# Patient Record
Sex: Male | Born: 1970 | Race: White | Hispanic: No | Smoking: Never smoker
Health system: Southern US, Community
[De-identification: ages and names within clinical notes are randomized; demographics above are authoritative.]

## PROBLEM LIST (undated history)

## (undated) DIAGNOSIS — R972 Elevated prostate specific antigen [PSA]: Secondary | ICD-10-CM

## (undated) DIAGNOSIS — R569 Unspecified convulsions: Secondary | ICD-10-CM

## (undated) DIAGNOSIS — I10 Essential (primary) hypertension: Secondary | ICD-10-CM

## (undated) DIAGNOSIS — E119 Type 2 diabetes mellitus without complications: Secondary | ICD-10-CM

## (undated) HISTORY — DX: Elevated prostate specific antigen (PSA): R97.20

## (undated) HISTORY — DX: Type 2 diabetes mellitus without complications: E11.9

## (undated) HISTORY — PX: OTHER SURGICAL HISTORY: SHX169

## (undated) HISTORY — PX: NO PAST SURGERIES: SHX2092

---

## 2007-02-05 ENCOUNTER — Ambulatory Visit: Payer: Self-pay | Admitting: Emergency Medicine

## 2007-02-22 ENCOUNTER — Ambulatory Visit: Payer: Self-pay | Admitting: Internal Medicine

## 2012-07-31 ENCOUNTER — Ambulatory Visit: Payer: Self-pay

## 2012-07-31 LAB — CBC WITH DIFFERENTIAL/PLATELET
Basophil #: 0.1 10*3/uL (ref 0.0–0.1)
Basophil %: 1 %
HCT: 45.8 % (ref 40.0–52.0)
HGB: 16.1 g/dL (ref 13.0–18.0)
Lymphocyte #: 2.1 10*3/uL (ref 1.0–3.6)
Lymphocyte %: 27.8 %
MCH: 31.1 pg (ref 26.0–34.0)
MCHC: 35.2 g/dL (ref 32.0–36.0)
Monocyte #: 0.8 x10 3/mm (ref 0.2–1.0)
Monocyte %: 11.3 %
Neutrophil #: 4.1 10*3/uL (ref 1.4–6.5)
Platelet: 226 10*3/uL (ref 150–440)
RDW: 12.3 % (ref 11.5–14.5)
WBC: 7.5 10*3/uL (ref 3.8–10.6)

## 2012-07-31 LAB — COMPREHENSIVE METABOLIC PANEL
Albumin: 4 g/dL (ref 3.4–5.0)
Anion Gap: 8 (ref 7–16)
BUN: 14 mg/dL (ref 7–18)
Calcium, Total: 9.3 mg/dL (ref 8.5–10.1)
Chloride: 105 mmol/L (ref 98–107)
Co2: 25 mmol/L (ref 21–32)
Creatinine: 1.03 mg/dL (ref 0.60–1.30)
Glucose: 113 mg/dL — ABNORMAL HIGH (ref 65–99)
SGPT (ALT): 51 U/L (ref 12–78)
Sodium: 138 mmol/L (ref 136–145)

## 2013-01-29 ENCOUNTER — Ambulatory Visit: Payer: Self-pay

## 2013-06-28 LAB — LIPID PANEL
CHOLESTEROL: 232 mg/dL — AB (ref 0–200)
HDL: 41 mg/dL (ref 35–70)
LDL Cholesterol: 165 mg/dL
Triglycerides: 128 mg/dL (ref 40–160)

## 2013-06-28 LAB — TSH: TSH: 1.6 u[IU]/mL (ref 0.41–5.90)

## 2013-06-28 LAB — CBC AND DIFFERENTIAL: Hemoglobin: 17.6 g/dL — AB (ref 13.5–17.5)

## 2013-06-28 LAB — BASIC METABOLIC PANEL
BUN: 13 mg/dL (ref 4–21)
CREATININE: 0.9 mg/dL (ref 0.6–1.3)

## 2015-03-05 ENCOUNTER — Ambulatory Visit: Payer: BLUE CROSS/BLUE SHIELD

## 2015-03-05 ENCOUNTER — Ambulatory Visit
Admission: EM | Admit: 2015-03-05 | Discharge: 2015-03-05 | Disposition: A | Discharge status: Home / Self Care | Payer: BLUE CROSS/BLUE SHIELD | Attending: Family Medicine | Admitting: Family Medicine

## 2015-03-05 ENCOUNTER — Encounter: Payer: Self-pay | Admitting: Emergency Medicine

## 2015-03-05 DIAGNOSIS — M25551 Pain in right hip: Secondary | ICD-10-CM | POA: Diagnosis not present

## 2015-03-05 DIAGNOSIS — L03115 Cellulitis of right lower limb: Secondary | ICD-10-CM

## 2015-03-05 MED ORDER — SULFAMETHOXAZOLE-TRIMETHOPRIM 800-160 MG PO TABS: 1.0000 | ORAL_TABLET | Freq: Two times a day (BID) | ORAL | Status: AC

## 2015-03-05 NOTE — ED Provider Notes (Signed)
Patient presents today with symptoms of right lateral hip pain. Patient states that he had a metal post about 4 days ago. He does not feel that the skin was broken at the time of the injury. He has started to experience a warmth to the hip area with some redness. He denies any bruising of the area. He does have some pain with walking due to the pain. He has also noticed a subjective fever. He is afebrile in the office today. He denies any history of right hip injury or trauma in the past. He admits to being up-to-date with his tetanus immunization within the last 5 years.  ROS: Negative except mentioned above.  GENERAL: NAD RESP: CTA B CARD: RRR MSK: mild to moderate circular area of erythema (4in x 3in) to the right proximal lateral thigh, this is slightly distal to where a trochanteric bursitis would be, no streaks, tenderness of the area, no streaks  NEURO: CN II-XII groslly intact   A/P: R Hip Injury, Cellulitis- xrays were negative for any acute bony process, will treat with Bactrim DS for 10 days, ice, elevation, monitoring for fever, I would like the patient to follow-up tomorrow or early Friday for interval change, work excuse given for today and tomorrow, if symptoms do worsen he is to seek immediate medical attention as discussed.   Paulina Fusi, MD 03/05/15 [phone]

## 2015-03-05 NOTE — ED Notes (Signed)
Pt hit right hip on a metal post x 4 days ago

## 2015-03-06 ENCOUNTER — Ambulatory Visit
Admission: EM | Admit: 2015-03-06 | Discharge: 2015-03-06 | Disposition: A | Discharge status: Home / Self Care | Payer: BLUE CROSS/BLUE SHIELD | Attending: Family Medicine | Admitting: Family Medicine

## 2015-03-06 ENCOUNTER — Encounter: Payer: Self-pay | Admitting: Emergency Medicine

## 2015-03-06 DIAGNOSIS — L03115 Cellulitis of right lower limb: Secondary | ICD-10-CM | POA: Diagnosis not present

## 2015-03-06 MED ORDER — NAPROXEN 500 MG PO TABS: 500.0000 mg | ORAL_TABLET | Freq: Two times a day (BID) | ORAL | Status: DC

## 2015-03-06 MED ORDER — MUPIROCIN 2 % EX OINT: 1.0000 "application " | TOPICAL_OINTMENT | Freq: Three times a day (TID) | CUTANEOUS | Status: DC

## 2015-03-06 NOTE — ED Notes (Signed)
Pt to have a recheck of cellulitis

## 2015-03-06 NOTE — Discharge Instructions (Signed)
Abscess An abscess is an infected area that contains a collection of pus and debris.It can occur in almost any part of the body. An abscess is also known as a furuncle or boil. CAUSES  An abscess occurs when tissue gets infected. This can occur from blockage of oil or sweat glands, infection of hair follicles, or a minor injury to the skin. As the body tries to fight the infection, pus collects in the area and creates pressure under the skin. This pressure causes pain. People with weakened immune systems have difficulty fighting infections and get certain abscesses more often.  SYMPTOMS Usually an abscess develops on the skin and becomes a painful mass that is red, warm, and tender. If the abscess forms under the skin, you may feel a moveable soft area under the skin. Some abscesses break open (rupture) on their own, but most will continue to get worse without care. The infection can spread deeper into the body and eventually into the bloodstream, causing you to feel ill.  DIAGNOSIS  Your caregiver will take your medical history and perform a physical exam. A sample of fluid may also be taken from the abscess to determine what is causing your infection. TREATMENT  Your caregiver may prescribe antibiotic medicines to fight the infection. However, taking antibiotics alone usually does not cure an abscess. Your caregiver may need to make a small cut (incision) in the abscess to drain the pus. In some cases, gauze is packed into the abscess to reduce pain and to continue draining the area. HOME CARE INSTRUCTIONS   Only take over-the-counter or prescription medicines for pain, discomfort, or fever as directed by your caregiver.  If you were prescribed antibiotics, take them as directed. Finish them even if you start to feel better.  If gauze is used, follow your caregiver's directions for changing the gauze.  To avoid spreading the infection:  Keep your draining abscess covered with a  bandage.  Wash your hands well.  Do not share personal care items, towels, or whirlpools with others.  Avoid skin contact with others.  Keep your skin and clothes clean around the abscess.  Keep all follow-up appointments as directed by your caregiver. SEEK MEDICAL CARE IF:   You have increased pain, swelling, redness, fluid drainage, or bleeding.  You have muscle aches, chills, or a general ill feeling.  You have a fever. MAKE SURE YOU:   Understand these instructions.  Will watch your condition.  Will get help right away if you are not doing well or get worse. Document Released: 03/10/2005 Document Revised: 11/30/2011 Document Reviewed: 08/13/2011 [facility] Patient Information 2015 [place], [place]. This information is not intended to replace advice given to you by your health care provider. Make sure you discuss any questions you have with your health care provider.  Bursitis Bursitis is a swelling and soreness (inflammation) of a fluid-filled sac (bursa) that overlies and protects a joint. It can be caused by injury, overuse of the joint, arthritis or infection. The joints most likely to be affected are the elbows, shoulders, hips and knees. HOME CARE INSTRUCTIONS   Apply ice to the affected area for 15-20 minutes each hour while awake for 2 days. Put the ice in a plastic bag and place a towel between the bag of ice and your skin.  Rest the injured joint as much as possible, but continue to put the joint through a full range of motion, 4 times per day. (The shoulder joint especially becomes rapidly "frozen" if  not used.) When the pain lessens, begin normal slow movements and usual activities.  Only take over-the-counter or prescription medicines for pain, discomfort or fever as directed by your caregiver.  Your caregiver may recommend draining the bursa and injecting medicine into the bursa. This may help the healing process.  Follow all instructions for follow-up with your  caregiver. This includes any orthopedic referrals, physical therapy and rehabilitation. Any delay in obtaining necessary care could result in a delay or failure of the bursitis to heal and chronic pain. SEEK IMMEDIATE MEDICAL CARE IF:   Your pain increases even during treatment.  You develop an oral temperature above 102 F (38.9 C) and have heat and inflammation over the involved bursa. MAKE SURE YOU:   Understand these instructions.  Will watch your condition.  Will get help right away if you are not doing well or get worse. Document Released: 05/28/2000 Document Revised: 08/23/2011 Document Reviewed: 08/20/2013 [facility] Patient Information 2015 [place], [place]. This information is not intended to replace advice given to you by your health care provider. Make sure you discuss any questions you have with your health care provider.

## 2015-03-06 NOTE — ED Provider Notes (Signed)
CSN: [id number]     Arrival date & time 03/06/15  1419 History   First MD Initiated Contact with Patient 03/06/15 1455     Chief Complaint  Patient presents with  . Cellulitis   (Consider location/radiation/quality/duration/timing/severity/associated sxs/prior Treatment) HPI   Is a 44 year old male seen here 2 days ago and treated for possible cellulitis of the right hip superficially over the greater trochanter. Taken 3 doses of the Septra and although it feels better he has noticed that the redness has seemed to spread more. Still feels very warm to touch he has no fever or chills. He works at 2 separate jobs where he stands on his feet all day. He does not specifically state that it is uncomfortable to walk he is not limping.  History reviewed. No pertinent past medical history. History reviewed. No pertinent past surgical history. History reviewed. No pertinent family history. Social History  Substance Use Topics  . Smoking status: Former Research scientist (life sciences)  . Smokeless tobacco: None  . Alcohol Use: Yes    Review of Systems  Constitutional: Negative for fever, chills, activity change and fatigue.  Skin: Positive for color change.  All other systems reviewed and are negative.   Allergies  Review of patient's allergies indicates no known allergies.  Home Medications   Prior to Admission medications   Medication Sig Start Date End Date Taking? Authorizing Provider  mupirocin ointment (BACTROBAN) 2 % Apply 1 application topically 3 (three) times daily. 03/06/15   Lorin Picket, PA-C  naproxen (NAPROSYN) 500 MG tablet Take 1 tablet (500 mg total) by mouth 2 (two) times daily with a meal. 03/06/15   Lorin Picket, PA-C  sulfamethoxazole-trimethoprim (BACTRIM DS,SEPTRA DS) 800-160 MG per tablet Take 1 tablet by mouth 2 (two) times daily. 03/05/15 03/15/15  Paulina Fusi, MD   Meds Ordered and Administered this Visit  Medications - No data to display  BP 159/105 mmHg  Pulse 88   Temp(Src) 99.5 F (37.5 C) (Tympanic)  Resp 20  SpO2 99% No data found.   Physical Exam  Constitutional: He is oriented to person, place, and time. He appears well-developed and well-nourished. No distress.  HENT:  Head: Normocephalic and atraumatic.  Eyes: Pupils are equal, round, and reactive to light.  Musculoskeletal:  Hip range of motion is comfortable and full. Relates with no limp. There is tenderness over the right greater trochanter areas his maximal pain and warmth.  Neurological: He is alert and oriented to person, place, and time.  Skin: Skin is warm and dry. Rash noted. He is not diaphoretic. There is erythema.  Erythema that has been outlined in surgical pen by Dr. Posey Pronto has spread somewhat beyond the borders. It is blanchable and macular.  Psychiatric: He has a normal mood and affect. His behavior is normal. Judgment and thought content normal.  Nursing note and vitals reviewed.   ED Course  Procedures (including critical care time)  Labs Review Labs Reviewed - No data to display  Imaging Review Dg Hip Unilat With Pelvis 2-3 Views Right  03/05/2015   CLINICAL DATA:  Right hip pain for 2 days after hitting metal corner.  EXAM: DG HIP (WITH OR WITHOUT PELVIS) 2-3V RIGHT  COMPARISON:  None.  FINDINGS: There is no evidence of hip fracture or dislocation. There is no evidence of arthropathy or other focal bone abnormality.  IMPRESSION: Negative.   Electronically Signed   By: Rolm Baptise M.D.   On: 03/05/2015 10:22     Visual Acuity  Review  Right Eye Distance:   Left Eye Distance:   Bilateral Distance:    Right Eye Near:   Left Eye Near:    Bilateral Near:         MDM   1. Cellulitis of right hip    Medications - No data to display        Discharge Medication List as of 03/06/2015  3:26 PM    START taking these medications   Details  mupirocin ointment (BACTROBAN) 2 % Apply 1 application topically 3 (three) times daily., Starting 03/06/2015, Until  Discontinued, Normal    naproxen (NAPROSYN) 500 MG tablet Take 1 tablet (500 mg total) by mouth 2 (two) times daily with a meal., Starting 03/06/2015, Until Discontinued, Normal               Plan: 1. Diagnosis reviewed with patient 2. rx as per orders; risks, benefits, potential side effects reviewed with patient 3. Recommend supportive treatment with warm compresses,rest. Naprosyn for pain.  4. F/u 2 days for wound check and progress  Discussion with the patient and recommended that we switch now from eyes to a heat have him apply warm compresses 4 times a day followed with the use of Bactroban ointment. Uncertain whether this is actually a cellulitis since her is no break in the skin or rather a bursitis that is now very inflamed. I will give him some Naprosyn for pain control. An additional keep him out of work since he is working 2 full-time jobs in order to allow him to concentrate on the use of the warm compresses. He has a great deal of warmth to touch over the greater trochanteric region in the area of erythema has spread beyond the borders of the previously outlined area. Will need to keep an eye on their continued to spread.  Lorin Picket, PA-C 03/06/15 1537

## 2015-03-11 ENCOUNTER — Encounter: Payer: Self-pay | Admitting: Emergency Medicine

## 2015-03-11 ENCOUNTER — Ambulatory Visit
Admission: EM | Admit: 2015-03-11 | Discharge: 2015-03-11 | Disposition: A | Discharge status: Home / Self Care | Payer: BLUE CROSS/BLUE SHIELD | Attending: Family Medicine | Admitting: Family Medicine

## 2015-03-11 DIAGNOSIS — M7061 Trochanteric bursitis, right hip: Secondary | ICD-10-CM

## 2015-03-11 HISTORY — DX: Essential (primary) hypertension: I10

## 2015-03-11 MED ORDER — HYDROCHLOROTHIAZIDE 25 MG PO TABS: 25.0000 mg | ORAL_TABLET | Freq: Every day | ORAL | Status: DC

## 2015-03-11 NOTE — ED Provider Notes (Signed)
CSN: [id number]     Arrival date & time 03/11/15  8916 History   First MD Initiated Contact with Patient 03/11/15 0801     Chief Complaint  Patient presents with  . Cellulitis   (Consider location/radiation/quality/duration/timing/severity/associated sxs/prior Treatment) HPI   Patient returns today rating it is much improved beginning Naprosyn and continue on with his Bactrim. He has much less redness and swelling and is not nearly as tender. He has no fever or chills. He has noticed that the "knot" persists although it is not as tender. Is asking for me to fill out his FMLA papers as well as he needs a prescription for his hydrochlorothiazide 25 mg daily until he is able to see his primary care physician.    Past Medical History  Diagnosis Date  . Hypertension    History reviewed. No pertinent past surgical history. History reviewed. No pertinent family history. Social History  Substance Use Topics  . Smoking status: Former Research scientist (life sciences)  . Smokeless tobacco: None  . Alcohol Use: Yes    Review of Systems  Constitutional: Positive for activity change. Negative for fever, chills and fatigue.  All other systems reviewed and are negative.   Allergies  Review of patient's allergies indicates no known allergies.  Home Medications   Prior to Admission medications   Medication Sig Start Date End Date Taking? Authorizing Provider  hydrochlorothiazide (HYDRODIURIL) 25 MG tablet Take 1 tablet (25 mg total) by mouth daily. 03/11/15   Lorin Picket, PA-C  mupirocin ointment (BACTROBAN) 2 % Apply 1 application topically 3 (three) times daily. 03/06/15   Lorin Picket, PA-C  naproxen (NAPROSYN) 500 MG tablet Take 1 tablet (500 mg total) by mouth 2 (two) times daily with a meal. 03/06/15   Lorin Picket, PA-C  sulfamethoxazole-trimethoprim (BACTRIM DS,SEPTRA DS) 800-160 MG per tablet Take 1 tablet by mouth 2 (two) times daily. 03/05/15 03/15/15  Paulina Fusi, MD   Meds Ordered and  Administered this Visit  Medications - No data to display  BP 156/98 mmHg  Pulse 71  Temp(Src) 99 F (37.2 C) (Tympanic)  Resp 16  Ht 5\' 9"  (1.753 m)  Wt 235 lb (106.595 kg)  BMI 34.69 kg/m2  SpO2 98% No data found.   Physical Exam  Constitutional: He is oriented to person, place, and time. He appears well-developed and well-nourished. No distress.  HENT:  Head: Normocephalic and atraumatic.  Eyes: Pupils are equal, round, and reactive to light.  Musculoskeletal: Normal range of motion. He exhibits no edema or tenderness.  Neurological: He is alert and oriented to person, place, and time.  Skin: Skin is warm and dry. No rash noted. He is not diaphoretic. No erythema.  Examination of the right hip shows much less erythema and swelling. There is mild tenderness over a subcutaneous nodule may represent early calcific bursitis over the greater trochanter. The x-ray did not show any abnormality from previously and was not redone today. He has a marked improvement.  Psychiatric: He has a normal mood and affect. His behavior is normal. Judgment and thought content normal.  Nursing note and vitals reviewed.   ED Course  Procedures (including critical care time)  Labs Review Labs Reviewed - No data to display  Imaging Review No results found.   Visual Acuity Review  Right Eye Distance:   Left Eye Distance:   Bilateral Distance:    Right Eye Near:   Left Eye Near:    Bilateral Near:  MDM   1. Greater trochanteric bursitis, right    Discharge Medication List as of 03/11/2015  8:21 AM    START taking these medications   Details  hydrochlorothiazide (HYDRODIURIL) 25 MG tablet Take 1 tablet (25 mg total) by mouth daily., Starting 03/11/2015, Until Discontinued, Print       Plan: 1.Diagnosis reviewed with patient 2. rx as per orders; risks, benefits, potential side effects reviewed with patient 3. Recommend supportive treatment with continued use of the  Naprosyn and finish up his prescription of Septra. I've advised him to follow-up with his primary care because of his elevated blood pressures in addition at his request I refilled his hydrochlorothiazide 25 mg a takes daily till he can establish an appointment with Dr. Army Melia. If the knot remains and is bothersome I recommended that he consider seeing an orthopedic surgeon for follow-up as if the bursa becomes calcific it may need excision. 4. F/u prn if symptoms worsen or don't improve   Lorin Picket, PA-C 03/11/15 [phone]

## 2015-03-11 NOTE — ED Notes (Signed)
Patient here for follow-up visit for possible cellulitis in his right hip. Patient reports improvement in his redness and swelling.  Patient denies fevers.

## 2015-03-11 NOTE — Discharge Instructions (Signed)
Bursitis °Bursitis is a swelling and soreness (inflammation) of a fluid-filled sac (bursa) that overlies and protects a joint. It can be caused by injury, overuse of the joint, arthritis or infection. The joints most likely to be affected are the elbows, shoulders, hips and knees. °HOME CARE INSTRUCTIONS  °· Apply ice to the affected area for 15-20 minutes each hour while awake for 2 days. Put the ice in a plastic bag and place a towel between the bag of ice and your skin. °· Rest the injured joint as much as possible, but continue to put the joint through a full range of motion, 4 times per day. (The shoulder joint especially becomes rapidly "frozen" if not used.) When the pain lessens, begin normal slow movements and usual activities. °· Only take over-the-counter or prescription medicines for pain, discomfort or fever as directed by your caregiver. °· Your caregiver may recommend draining the bursa and injecting medicine into the bursa. This may help the healing process. °· Follow all instructions for follow-up with your caregiver. This includes any orthopedic referrals, physical therapy and rehabilitation. Any delay in obtaining necessary care could result in a delay or failure of the bursitis to heal and chronic pain. °SEEK IMMEDIATE MEDICAL CARE IF:  °· Your pain increases even during treatment. °· You develop an oral temperature above 102° F (38.9° C) and have heat and inflammation over the involved bursa. °MAKE SURE YOU:  °· Understand these instructions. °· Will watch your condition. °· Will get help right away if you are not doing well or get worse. °Document Released: 05/28/2000 Document Revised: 08/23/2011 Document Reviewed: 08/20/2013 °[employer]® Patient Information ©2015 [employer]. This information is not intended to replace advice given to you by your health care provider. Make sure you discuss any questions you have with your health care provider. ° °

## 2015-05-28 ENCOUNTER — Encounter: Payer: Self-pay | Admitting: Internal Medicine

## 2015-05-28 DIAGNOSIS — G4700 Insomnia, unspecified: Secondary | ICD-10-CM | POA: Insufficient documentation

## 2015-05-28 DIAGNOSIS — I10 Essential (primary) hypertension: Secondary | ICD-10-CM | POA: Insufficient documentation

## 2015-05-28 DIAGNOSIS — I8390 Asymptomatic varicose veins of unspecified lower extremity: Secondary | ICD-10-CM | POA: Insufficient documentation

## 2015-05-28 DIAGNOSIS — Z8249 Family history of ischemic heart disease and other diseases of the circulatory system: Secondary | ICD-10-CM | POA: Insufficient documentation

## 2015-08-19 ENCOUNTER — Ambulatory Visit
Admission: EM | Admit: 2015-08-19 | Discharge: 2015-08-19 | Disposition: A | Discharge status: Home / Self Care | Payer: Worker's Compensation | Attending: Family Medicine | Admitting: Family Medicine

## 2015-08-19 ENCOUNTER — Encounter: Payer: Self-pay | Admitting: Emergency Medicine

## 2015-08-19 DIAGNOSIS — S39012A Strain of muscle, fascia and tendon of lower back, initial encounter: Secondary | ICD-10-CM

## 2015-08-19 MED ORDER — NAPROXEN 500 MG PO TABS: 500.0000 mg | ORAL_TABLET | Freq: Two times a day (BID) | ORAL | Status: DC

## 2015-08-19 MED ORDER — METAXALONE 800 MG PO TABS: 800.0000 mg | ORAL_TABLET | Freq: Three times a day (TID) | ORAL | Status: DC

## 2015-08-19 NOTE — ED Notes (Addendum)
Patient states that on Sunday at work he was changing a battery out of a car and pulled a muscle in his lower back.  Patient states that he was seen at [facility] ED on Sunday for this injury. Patient states that he was given prescription for Valium and Naprosyn for his pain.

## 2015-08-19 NOTE — Discharge Instructions (Signed)

## 2015-08-19 NOTE — ED Provider Notes (Signed)
CSN: [id number]     Arrival date & time 08/19/15  1525 History   First MD Initiated Contact with Patient 08/19/15 1849     Chief Complaint  Patient presents with  . Back Pain  . 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 [facility] emergency department on Sunday given Valium and Naprosyn and told to take off work through March 9. He was notified by [facility] today that he needed to be seen here. he is not having any radiation of the pain. He has had no incontinence. The previous back injuries which have resolved with a small amount of care. States that the Valium and the Naprosyn has been starting to work.  Past Medical History  Diagnosis Date  . Hypertension    History reviewed. No pertinent past surgical history. Family History  Problem Relation Age of Onset  . CAD Father   . Lung cancer Mother   . Hypertension Father    Social History  Substance Use Topics  . Smoking status: Former Research scientist (life sciences)  . Smokeless tobacco: None  . Alcohol Use: Yes    Review of Systems  Constitutional: Positive for activity change. Negative for fever, chills and fatigue.  Musculoskeletal: Positive for back pain.  All other systems reviewed and are negative.   Allergies  Review of patient's allergies indicates no known allergies.  Home Medications   Prior to Admission medications   Medication Sig Start Date End Date Taking? Authorizing Provider  hydrochlorothiazide (HYDRODIURIL) 25 MG tablet Take 1 tablet (25 mg total) by mouth daily. 03/11/15   Lorin Picket, PA-C  metaxalone (SKELAXIN) 800 MG tablet Take 1 tablet (800 mg total) by mouth 3 (three) times daily. 08/19/15   Lorin Picket, PA-C  mupirocin ointment (BACTROBAN) 2 % Apply 1 application topically 3 (three) times  daily. 03/06/15   Lorin Picket, PA-C  naproxen (NAPROSYN) 500 MG tablet Take 1 tablet (500 mg total) by mouth 2 (two) times daily with a meal. 08/19/15   Lorin Picket, PA-C   Meds Ordered and Administered this Visit  Medications - No data to display  BP 151/99 mmHg  Pulse 85  Temp(Src) 98.5 F (36.9 C) (Tympanic)  Resp 16  Ht 5\' 7"  (1.702 m)  Wt 243 lb (110.224 kg)  BMI 38.05 kg/m2  SpO2 98% No data found.   Physical Exam  Constitutional: He is oriented to person, place, and time. He appears well-developed and well-nourished. No distress.  HENT:  Head: Normocephalic and atraumatic.  Eyes: Conjunctivae are normal. Pupils are equal, round, and reactive to light.  Neck: Normal range of motion. Neck supple.  Musculoskeletal: He exhibits tenderness.  Examination of the lumbar spine shows palpable spasm on the right. The patient is able to forward flex to touching his toes. Lateral flexion per degree to the right increases the spasm and is more painful. He is able to toe and heel walk without difficulty. Straight leg raise testing is negative at 90 in the sitting position.  Neurological: He is alert and oriented to person, place, and time.  Skin: Skin is warm and dry. He is not diaphoretic.  Psychiatric: He has a normal mood and affect. His behavior is normal. Judgment and thought content normal.  Nursing note and vitals reviewed.   ED  Course  Procedures (including critical care time)  Labs Review Labs Reviewed - No data to display  Imaging Review No results found.   Visual Acuity Review  Right Eye Distance:   Left Eye Distance:   Bilateral Distance:    Right Eye Near:   Left Eye Near:    Bilateral Near:         MDM   1. Lumbar strain, initial encounter    Discharge Medication List as of 08/19/2015  7:18 PM    START taking these medications   Details  metaxalone (SKELAXIN) 800 MG tablet Take 1 tablet (800 mg total) by mouth 3 (three) times daily.,  Starting 08/19/2015, Until Discontinued, Normal      Plan: 1. Diagnosis reviewed with patient 2. rx as per orders; risks, benefits, potential side effects reviewed with patient 3. Recommend supportive treatment with avoidance of symptoms as much as possible. Limit Sitting lifting and bending. Follow up with Adelina Mings FNP. I've given him restrictions at work he is evaluated by Adelina Mings.    Lorin Picket, PA-C 08/19/15 1935

## 2016-04-03 IMAGING — CR DG HIP (WITH OR WITHOUT PELVIS) 2-3V*R*
3 series · 3 of 3 positions shown · non-contrast
Comparison: None.

CLINICAL DATA: Right hip pain for 2 days after hitting metal
corner.

EXAM:
DG HIP (WITH OR WITHOUT PELVIS) 2-3V RIGHT

[pelvis ap]
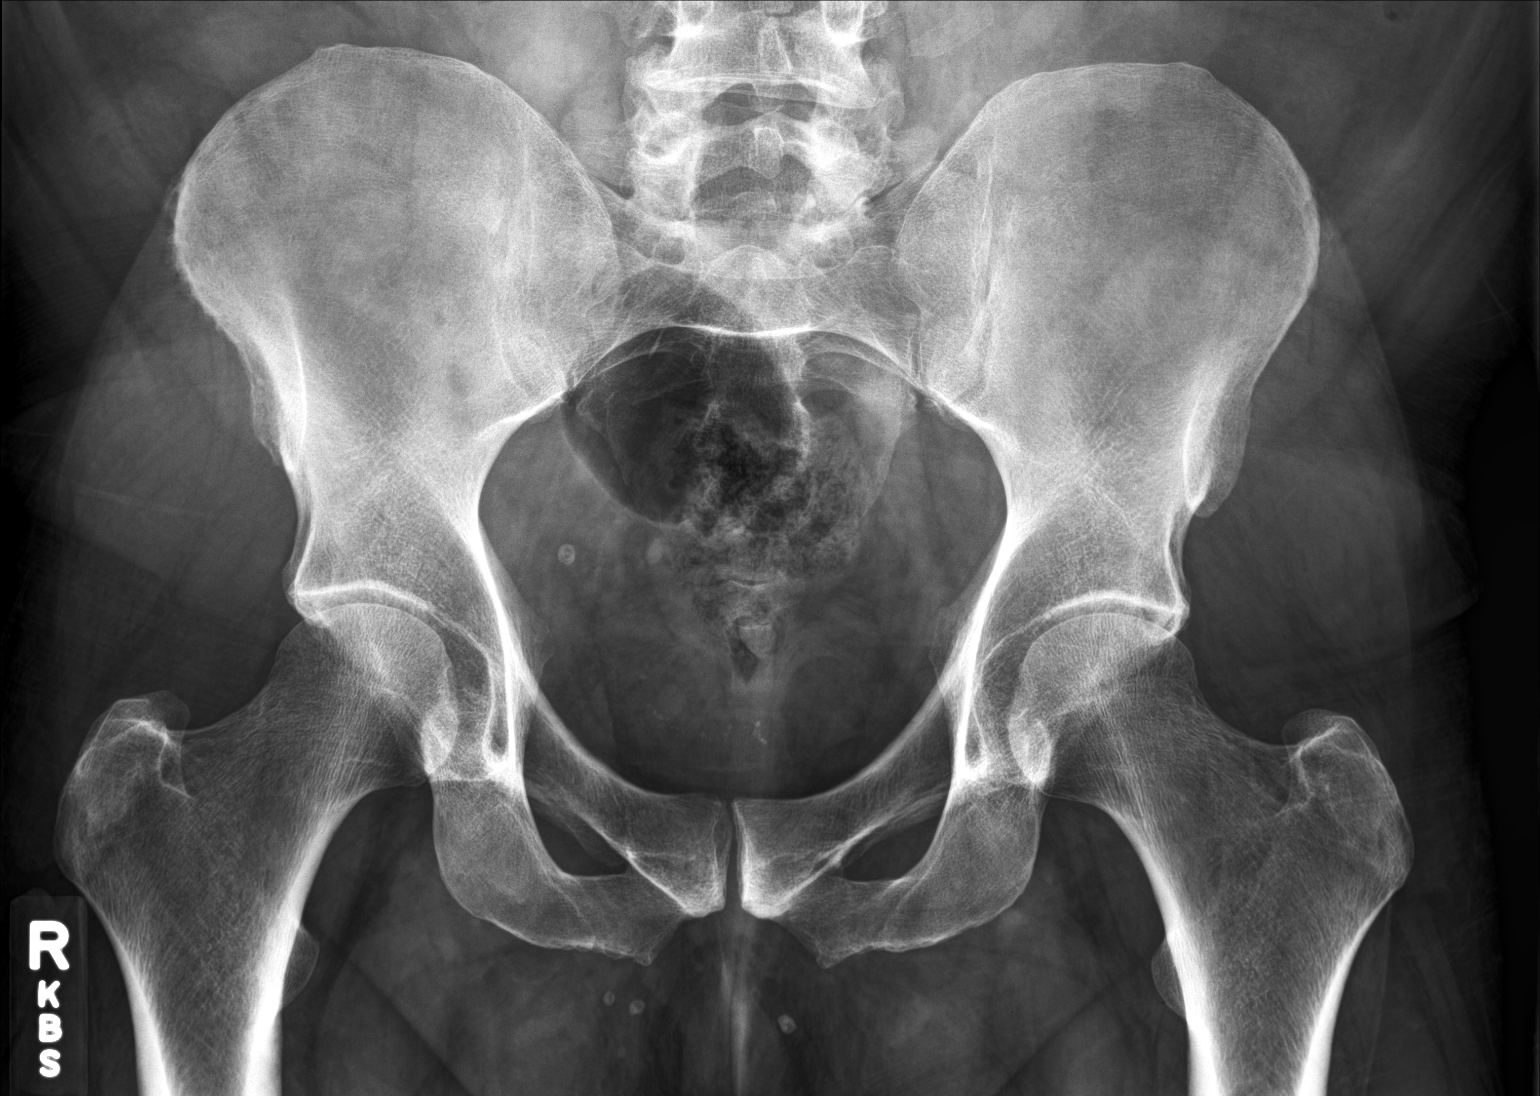

[hip ap]
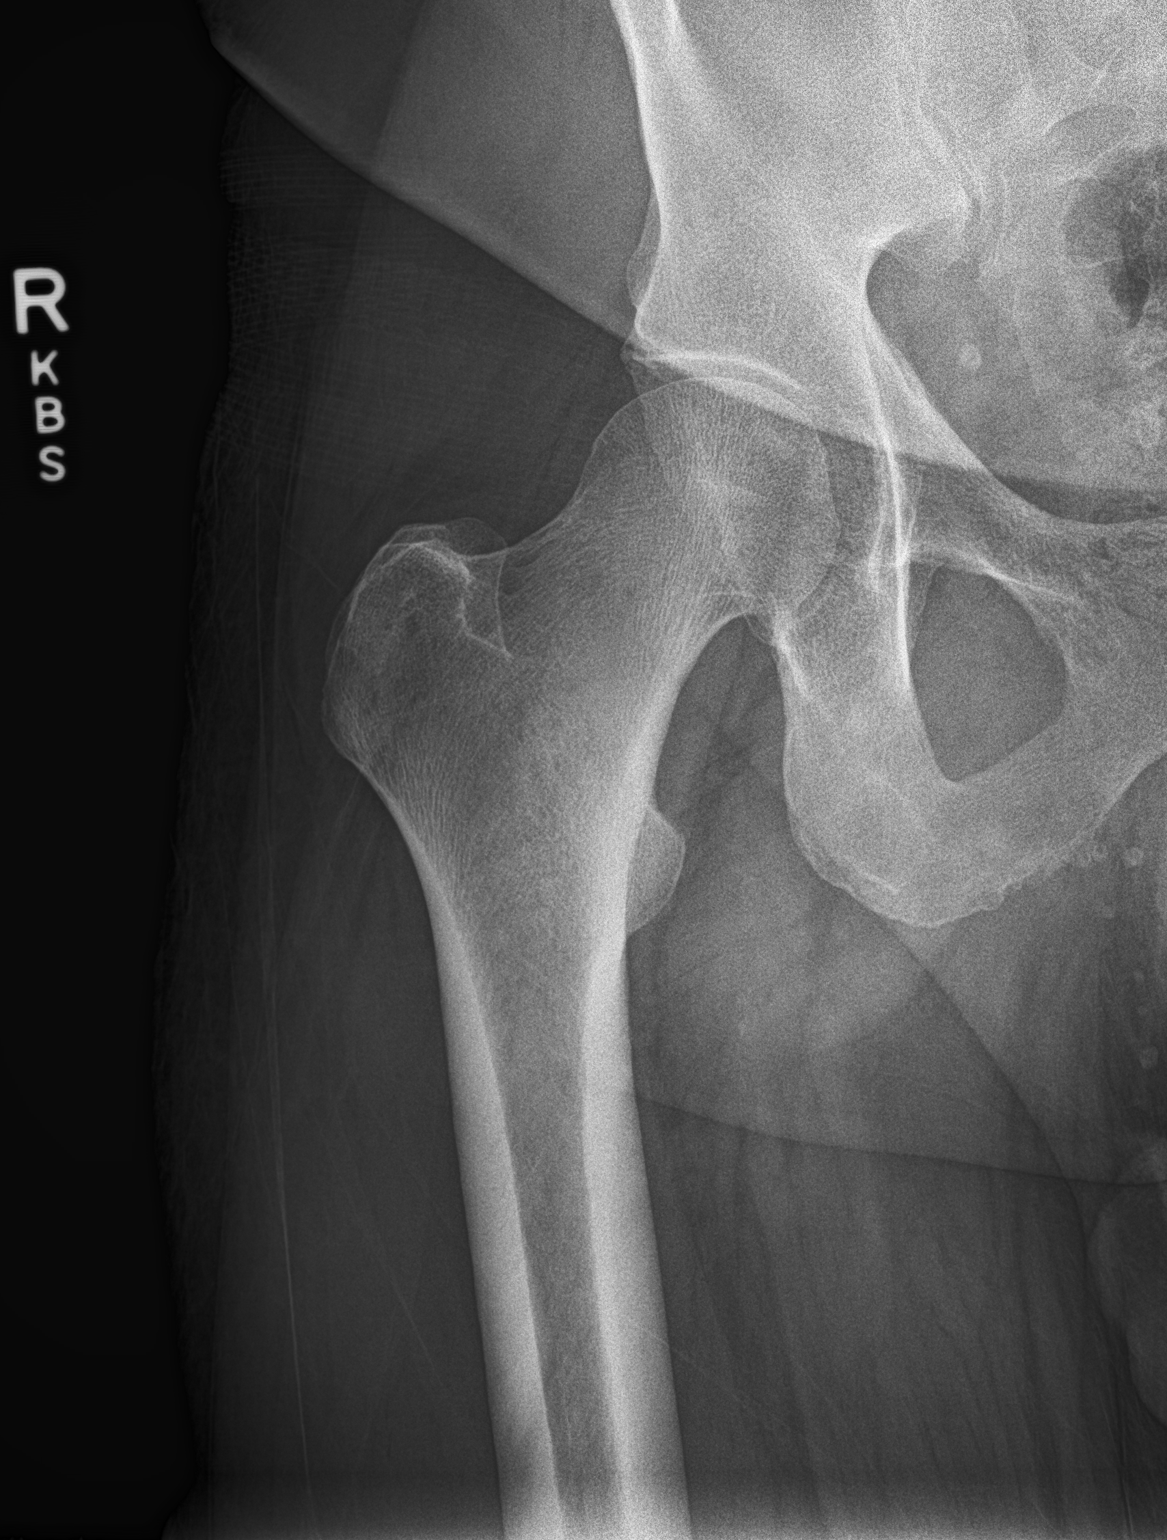

[hip lat]
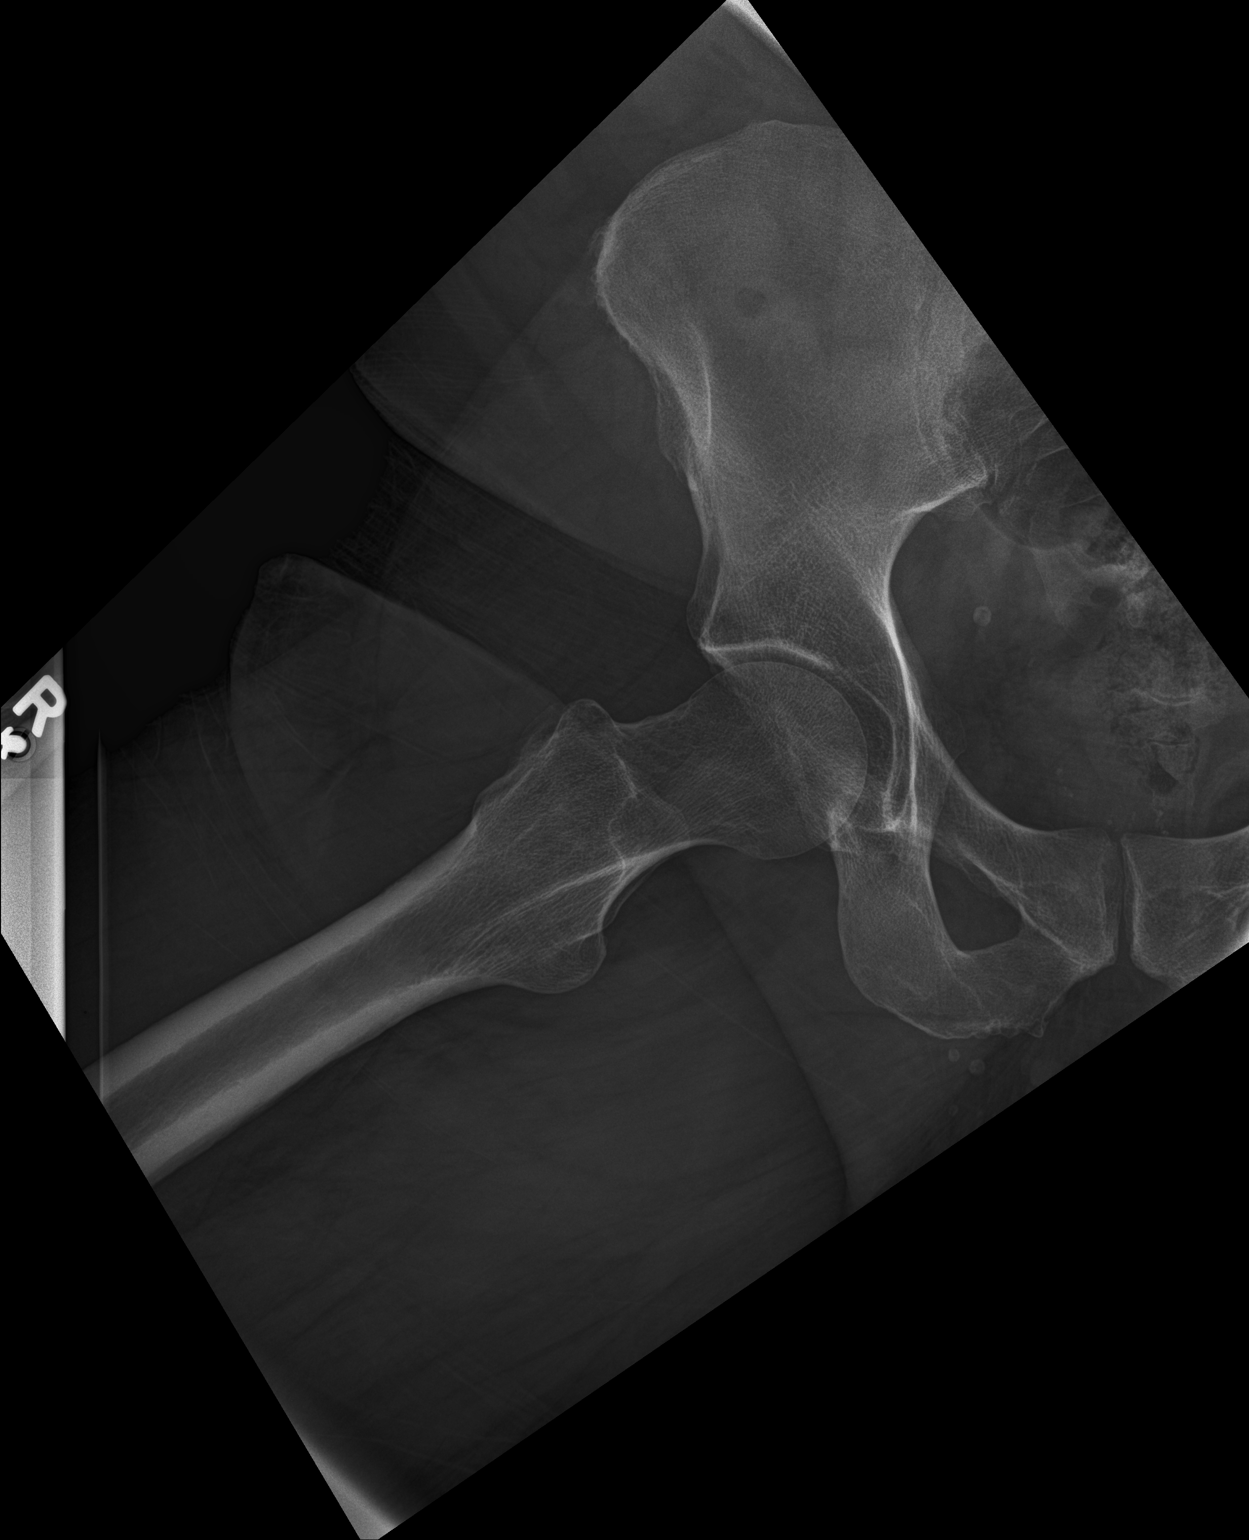

[3 of 3 positions shown; findings below may reference images not displayed]

FINDINGS: There is no evidence of hip fracture or dislocation. There is no
evidence of arthropathy or other focal bone abnormality.
IMPRESSION: Negative.

## 2016-06-24 ENCOUNTER — Ambulatory Visit (INDEPENDENT_AMBULATORY_CARE_PROVIDER_SITE_OTHER): Payer: BLUE CROSS/BLUE SHIELD | Admitting: Internal Medicine

## 2016-06-24 ENCOUNTER — Encounter: Payer: Self-pay | Admitting: Internal Medicine

## 2016-06-24 VITALS — BP 160/102 | HR 78 | Temp 97.6°F | Ht 68.0 in | Wt 243.0 lb

## 2016-06-24 DIAGNOSIS — Z0000 Encounter for general adult medical examination without abnormal findings: Secondary | ICD-10-CM

## 2016-06-24 DIAGNOSIS — Z125 Encounter for screening for malignant neoplasm of prostate: Secondary | ICD-10-CM | POA: Diagnosis not present

## 2016-06-24 DIAGNOSIS — I10 Essential (primary) hypertension: Secondary | ICD-10-CM

## 2016-06-24 DIAGNOSIS — M722 Plantar fascial fibromatosis: Secondary | ICD-10-CM | POA: Diagnosis not present

## 2016-06-24 MED ORDER — VALSARTAN-HYDROCHLOROTHIAZIDE 160-12.5 MG PO TABS: 1.0000 | ORAL_TABLET | Freq: Every day | ORAL | 3 refills | Status: DC

## 2016-06-24 MED ORDER — PREDNISONE 10 MG PO TABS: ORAL_TABLET | ORAL | 0 refills | Status: DC

## 2016-06-24 NOTE — Patient Instructions (Signed)
DASH Eating Plan DASH stands for "Dietary Approaches to Stop Hypertension." The DASH eating plan is a healthy eating plan that has been shown to reduce high blood pressure (hypertension). Additional health benefits may include reducing the risk of type 2 diabetes mellitus, heart disease, and stroke. The DASH eating plan may also help with weight loss. What do I need to know about the DASH eating plan? For the DASH eating plan, you will follow these general guidelines:  Choose foods with less than 150 milligrams of sodium per serving (as listed on the food label).  Use salt-free seasonings or herbs instead of table salt or sea salt.  Check with your health care provider or pharmacist before using salt substitutes.  Eat lower-sodium products. These are often labeled as "low-sodium" or "no salt added."  Eat fresh foods. Avoid eating a lot of canned foods.  Eat more vegetables, fruits, and low-fat dairy products.  Choose whole grains. Look for the word "whole" as the first word in the ingredient list.  Choose fish and skinless chicken or turkey more often than red meat. Limit fish, poultry, and meat to 6 oz (170 g) each day.  Limit sweets, desserts, sugars, and sugary drinks.  Choose heart-healthy fats.  Eat more home-cooked food and less restaurant, buffet, and fast food.  Limit fried foods.  Do not fry foods. Cook foods using methods such as baking, boiling, grilling, and broiling instead.  When eating at a restaurant, ask that your food be prepared with less salt, or no salt if possible. What foods can I eat? Seek help from a dietitian for individual calorie needs. Grains  Whole grain or whole wheat bread. Brown rice. Whole grain or whole wheat pasta. Quinoa, bulgur, and whole grain cereals. Low-sodium cereals. Corn or whole wheat flour tortillas. Whole grain cornbread. Whole grain crackers. Low-sodium crackers. Vegetables  Fresh or frozen vegetables (raw, steamed, roasted, or  grilled). Low-sodium or reduced-sodium tomato and vegetable juices. Low-sodium or reduced-sodium tomato sauce and paste. Low-sodium or reduced-sodium canned vegetables. Fruits  All fresh, canned (in natural juice), or frozen fruits. Meat and Other Protein Products  Ground beef (85% or leaner), grass-fed beef, or beef trimmed of fat. Skinless chicken or turkey. Ground chicken or turkey. Pork trimmed of fat. All fish and seafood. Eggs. Dried beans, peas, or lentils. Unsalted nuts and seeds. Unsalted canned beans. Dairy  Low-fat dairy products, such as skim or 1% milk, 2% or reduced-fat cheeses, low-fat ricotta or cottage cheese, or plain low-fat yogurt. Low-sodium or reduced-sodium cheeses. Fats and Oils  Tub margarines without trans fats. Light or reduced-fat mayonnaise and salad dressings (reduced sodium). Avocado. Safflower, olive, or canola oils. Natural peanut or almond butter. Other  Unsalted popcorn and pretzels. The items listed above may not be a complete list of recommended foods or beverages. Contact your dietitian for more options.  What foods are not recommended? Grains  White bread. White pasta. White rice. Refined cornbread. Bagels and croissants. Crackers that contain trans fat. Vegetables  Creamed or fried vegetables. Vegetables in a cheese sauce. Regular canned vegetables. Regular canned tomato sauce and paste. Regular tomato and vegetable juices. Fruits  Canned fruit in light or heavy syrup. Fruit juice. Meat and Other Protein Products  Fatty cuts of meat. Ribs, chicken wings, bacon, sausage, bologna, salami, chitterlings, fatback, hot dogs, bratwurst, and packaged luncheon meats. Salted nuts and seeds. Canned beans with salt. Dairy  Whole or 2% milk, cream, half-and-half, and cream cheese. Whole-fat or sweetened yogurt. Full-fat cheeses   or blue cheese. Nondairy creamers and whipped toppings. Processed cheese, cheese spreads, or cheese curds. Condiments  Onion and garlic  salt, seasoned salt, table salt, and sea salt. Canned and packaged gravies. Worcestershire sauce. Tartar sauce. Barbecue sauce. Teriyaki sauce. Soy sauce, including reduced sodium. Steak sauce. Fish sauce. Oyster sauce. Cocktail sauce. Horseradish. Ketchup and mustard. Meat flavorings and tenderizers. Bouillon cubes. Hot sauce. Tabasco sauce. Marinades. Taco seasonings. Relishes. Fats and Oils  Butter, stick margarine, lard, shortening, ghee, and bacon fat. Coconut, palm kernel, or palm oils. Regular salad dressings. Other  Pickles and olives. Salted popcorn and pretzels. The items listed above may not be a complete list of foods and beverages to avoid. Contact your dietitian for more information.  Where can I find more information? National Heart, Lung, and Blood Institute: www.nhlbi.nih.gov/health/health-topics/topics/dash/ This information is not intended to replace advice given to you by your health care provider. Make sure you discuss any questions you have with your health care provider. Document Released: 05/20/2011 Document Revised: 11/06/2015 Document Reviewed: 04/04/2013 Elsevier Interactive Patient Education  2017 Elsevier Inc.  

## 2016-06-24 NOTE — Progress Notes (Signed)
Date:  06/24/2016   Name:  William [name]   DOB:  [birth year]   MRN:  [id number]   Chief Complaint: Annual Exam and Ankle Pain William [name] is a 46 y.o. male who presents today for his Complete Annual Exam. He feels fairly well. He reports exercising none. He reports he is sleeping well.  He has not been seen for 2.5 years.  He quit his BP meds 18 months ago.  He does not have any excuse but is now in a new relationship and has promised to take better care of himself.  He is working 2 jobs and only sleeps about 4 hours per day.    Hypertension  This is a chronic problem. The current episode started more than 1 year ago. The problem is uncontrolled (he has been out of medication for 18 months). Associated symptoms include peripheral edema. Pertinent negatives include no blurred vision, chest pain, headaches, palpitations or shortness of breath. There are no associated agents to hypertension. Past treatments include ACE inhibitors and diuretics. The current treatment provides moderate improvement.  Ankle Pain   There was no injury mechanism. The pain is present in the left ankle and right ankle. The quality of the pain is described as aching. The pain is mild. Associated symptoms include an inability to bear weight. Associated symptoms comments: Pain on the in-step and at the heel bilaterally. The symptoms are aggravated by weight bearing (works on his feet on concrete). He has tried heat and NSAIDs for the symptoms. The treatment provided mild relief.     Review of Systems  Constitutional: Negative for chills, fatigue and fever.  Eyes: Negative for blurred vision and visual disturbance.  Respiratory: Negative for cough, chest tightness, shortness of breath and wheezing.   Cardiovascular: Negative for chest pain, palpitations and leg swelling.  Gastrointestinal: Negative for abdominal pain, constipation and diarrhea.  Endocrine: Negative for polydipsia and polyuria.  Genitourinary:  Negative for dysuria and hematuria.  Musculoskeletal: Positive for arthralgias and gait problem.  Skin: Negative for rash.  Neurological: Negative for dizziness and headaches.  Hematological: Negative for adenopathy.  Psychiatric/Behavioral: Negative for sleep disturbance and suicidal ideas.    Patient Active Problem List   Diagnosis Date Noted  . Family history of premature coronary heart disease 05/28/2015  . BP (high blood pressure) 05/28/2015  . Insomnia, persistent 05/28/2015  . 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 Authorizing Provider      Yes Lorin Picket, PA-C  naproxen (NAPROSYN) 500 MG tablet Take 1 tablet (500 mg total) by mouth 2 (two) times daily with a meal. 08/19/15  Yes Lorin Picket, PA-C       Historical Provider, MD       Lorin Picket, PA-C    No Known Allergies  Past Surgical History:  Procedure Laterality Date  . none      Social History  Substance Use Topics  . Smoking status: Never Smoker  . Smokeless tobacco: Never Used  . Alcohol use 0.6 oz/week    1 Standard drinks or equivalent per week     Medication list has been reviewed and updated.   Physical Exam  Constitutional: He is oriented to person, place, and time. He appears well-developed and well-nourished.  HENT:  Head: Normocephalic.  Right Ear: Tympanic membrane, external ear and ear canal normal.  Left Ear: Tympanic membrane, external ear and ear canal normal.  Nose: Nose normal.  Mouth/Throat: Uvula is midline and oropharynx is clear and moist.  Eyes: Conjunctivae and EOM are normal. Pupils are equal, round, and reactive to light.  Neck: Normal range of motion. Neck supple. Carotid bruit is not present. No thyromegaly present.  Cardiovascular: Normal rate, regular rhythm, normal heart sounds and intact distal pulses.   Pulmonary/Chest: Effort normal and breath sounds normal. He has no wheezes. Right breast  exhibits no mass. Left breast exhibits no mass.  Abdominal: Soft. Normal appearance and bowel sounds are normal. There is no hepatosplenomegaly. There is no tenderness.  Musculoskeletal: Normal range of motion. He exhibits edema.       Feet:  Lymphadenopathy:    He has no cervical adenopathy.  Neurological: He is alert and oriented to person, place, and time. He has normal strength and normal reflexes.  Skin: Skin is warm, dry and intact.  Psychiatric: He has a normal mood and affect. His speech is normal and behavior is normal. Judgment and thought content normal.  Nursing note and vitals reviewed.   BP (!) 160/102   Pulse 78   Temp 97.6 F (36.4 C)   Ht 5\' 8"  (1.727 m)   Wt 243 lb (110.2 kg)   SpO2 95%   BMI 36.95 kg/m   Assessment and Plan: 1. Annual physical exam - Lipid panel  2. Essential hypertension Begin medication - valsartan-hydrochlorothiazide (DIOVAN HCT) 160-12.5 MG tablet; Take 1 tablet by mouth daily.  Dispense: 30 tablet; Refill: 3 - Comprehensive metabolic panel - CBC with Differential/Platelet - TSH  3. Plantar fasciitis, bilateral Ice massage - predniSONE (DELTASONE) 10 MG tablet; Take 6 on day 1, 5 on day 2, 4 on day 3, 3 on day 4, 2 on day 5 and 1 on day 1 then stop.  Dispense: 21 tablet; Refill: 0  4. Prostate cancer screening DRE deferred to lack of symptoms - PSA   Halina Maidens, MD [facility]  06/24/2016

## 2016-06-25 LAB — LIPID PANEL
CHOLESTEROL TOTAL: 199 mg/dL (ref 100–199)
Chol/HDL Ratio: 5.2 ratio units — ABNORMAL HIGH (ref 0.0–5.0)
HDL: 38 mg/dL — ABNORMAL LOW (ref >39)
LDL Calculated: 133 mg/dL — ABNORMAL HIGH (ref 0–99)
TRIGLYCERIDES: 138 mg/dL (ref 0–149)
VLDL CHOLESTEROL CAL: 28 mg/dL (ref 5–40)

## 2016-06-25 LAB — COMPREHENSIVE METABOLIC PANEL
A/G RATIO: 1.7 (ref 1.2–2.2)
ALK PHOS: 59 IU/L (ref 39–117)
ALT: 49 IU/L — AB (ref 0–44)
AST: 30 IU/L (ref 0–40)
Albumin: 4.6 g/dL (ref 3.5–5.5)
BILIRUBIN TOTAL: 0.6 mg/dL (ref 0.0–1.2)
BUN/Creatinine Ratio: 16 (ref 9–20)
BUN: 16 mg/dL (ref 6–24)
CHLORIDE: 97 mmol/L (ref 96–106)
CO2: 22 mmol/L (ref 18–29)
Calcium: 9.3 mg/dL (ref 8.7–10.2)
Creatinine, Ser: 1 mg/dL (ref 0.76–1.27)
GFR calc Af Amer: 103 mL/min/{1.73_m2} (ref >59)
GFR calc non Af Amer: 89 mL/min/{1.73_m2} (ref >59)
GLUCOSE: 106 mg/dL — AB (ref 65–99)
Globulin, Total: 2.7 g/dL (ref 1.5–4.5)
POTASSIUM: 4.6 mmol/L (ref 3.5–5.2)
Sodium: 136 mmol/L (ref 134–144)
Total Protein: 7.3 g/dL (ref 6.0–8.5)

## 2016-06-25 LAB — CBC WITH DIFFERENTIAL/PLATELET
BASOS ABS: 0.1 10*3/uL (ref 0.0–0.2)
Basos: 1 %
EOS (ABSOLUTE): 0.3 10*3/uL (ref 0.0–0.4)
Eos: 5 %
Hematocrit: 49.8 % (ref 37.5–51.0)
Hemoglobin: 16.7 g/dL (ref 13.0–17.7)
IMMATURE GRANS (ABS): 0 10*3/uL (ref 0.0–0.1)
Immature Granulocytes: 1 %
LYMPHS: 24 %
Lymphocytes Absolute: 1.6 10*3/uL (ref 0.7–3.1)
MCH: 31.5 pg (ref 26.6–33.0)
MCHC: 33.5 g/dL (ref 31.5–35.7)
MCV: 94 fL (ref 79–97)
MONOCYTES: 10 %
Monocytes Absolute: 0.7 10*3/uL (ref 0.1–0.9)
NEUTROS ABS: 4 10*3/uL (ref 1.4–7.0)
Neutrophils: 59 %
PLATELETS: 209 10*3/uL (ref 150–379)
RBC: 5.31 x10E6/uL (ref 4.14–5.80)
RDW: 13.1 % (ref 12.3–15.4)
WBC: 6.6 10*3/uL (ref 3.4–10.8)

## 2016-06-25 LAB — TSH: TSH: 2.49 u[IU]/mL (ref 0.450–4.500)

## 2016-06-25 LAB — PSA: PROSTATE SPECIFIC AG, SERUM: 1 ng/mL (ref 0.0–4.0)

## 2016-08-03 ENCOUNTER — Ambulatory Visit: Payer: BLUE CROSS/BLUE SHIELD | Admitting: Internal Medicine

## 2016-08-06 ENCOUNTER — Ambulatory Visit (INDEPENDENT_AMBULATORY_CARE_PROVIDER_SITE_OTHER): Payer: BLUE CROSS/BLUE SHIELD | Admitting: Internal Medicine

## 2016-08-06 ENCOUNTER — Encounter: Payer: Self-pay | Admitting: Internal Medicine

## 2016-08-06 VITALS — BP 125/82 | HR 68 | Ht 68.0 in | Wt 246.0 lb

## 2016-08-06 DIAGNOSIS — E784 Other hyperlipidemia: Secondary | ICD-10-CM

## 2016-08-06 DIAGNOSIS — I10 Essential (primary) hypertension: Secondary | ICD-10-CM

## 2016-08-06 DIAGNOSIS — E1169 Type 2 diabetes mellitus with other specified complication: Secondary | ICD-10-CM | POA: Insufficient documentation

## 2016-08-06 DIAGNOSIS — E785 Hyperlipidemia, unspecified: Secondary | ICD-10-CM | POA: Insufficient documentation

## 2016-08-06 MED ORDER — AMLODIPINE BESYLATE 5 MG PO TABS: 5.0000 mg | ORAL_TABLET | Freq: Every day | ORAL | 3 refills | Status: DC

## 2016-08-06 NOTE — Patient Instructions (Addendum)
Get a high quality Fish oil to take daily to help with cholesterol.  Mediterranean Diet A Mediterranean diet refers to food and lifestyle choices that are based on the traditions of countries located on the [employer]. This way of eating has been shown to help prevent certain conditions and improve outcomes for people who have chronic diseases, like kidney disease and heart disease. What are tips for following this plan? Lifestyle  Cook and eat meals together with your family, when possible.  Drink enough fluid to keep your urine clear or pale yellow.  Be physically active every day. This includes:  Aerobic exercise like running or swimming.  Leisure activities like gardening, walking, or housework.  Get 7-8 hours of sleep each night.  If recommended by your health care provider, drink red wine in moderation. This means 1 glass a day for nonpregnant women and 2 glasses a day for men. A glass of wine equals 5 oz (150 mL). Reading food labels  Check the serving size of packaged foods. For foods such as rice and pasta, the serving size refers to the amount of cooked product, not dry.  Check the total fat in packaged foods. Avoid foods that have saturated fat or trans fats.  Check the ingredients list for added sugars, such as corn syrup. Shopping  At the grocery store, buy most of your food from the areas near the walls of the store. This includes:  Fresh fruits and vegetables (produce).  Grains, beans, nuts, and seeds. Some of these may be available in unpackaged forms or large amounts (in bulk).  Fresh seafood.  Poultry and eggs.  Low-fat dairy products.  Buy whole ingredients instead of prepackaged foods.  Buy fresh fruits and vegetables in-season from local farmers markets.  Buy frozen fruits and vegetables in resealable bags.  If you do not have access to quality fresh seafood, buy precooked frozen shrimp or canned fish, such as tuna, salmon, or  sardines.  Buy small amounts of raw or cooked vegetables, salads, or olives from the deli or salad bar at your store.  Stock your pantry so you always have certain foods on hand, such as olive oil, canned tuna, canned tomatoes, rice, pasta, and beans. Cooking  Cook foods with extra-virgin olive oil instead of using butter or other vegetable oils.  Have meat as a side dish, and have vegetables or grains as your main dish. This means having meat in small portions or adding small amounts of meat to foods like pasta or stew.  Use beans or vegetables instead of meat in common dishes like chili or lasagna.  Experiment with different cooking methods. Try roasting or broiling vegetables instead of steaming or sauteing them.  Add frozen vegetables to soups, stews, pasta, or rice.  Add nuts or seeds for added healthy fat at each meal. You can add these to yogurt, salads, or vegetable dishes.  Marinate fish or vegetables using olive oil, lemon juice, garlic, and fresh herbs. Meal planning  Plan to eat 1 vegetarian meal one day each week. Try to work up to 2 vegetarian meals, if possible.  Eat seafood 2 or more times a week.  Have healthy snacks readily available, such as:  Vegetable sticks with hummus.  Greek yogurt.  Fruit and nut trail mix.  Eat balanced meals throughout the week. This includes:  Fruit: 2-3 servings a day  Vegetables: 4-5 servings a day  Low-fat dairy: 2 servings a day  Fish, poultry, or lean meat: 1 serving  a day  Beans and legumes: 2 or more servings a week  Nuts and seeds: 1-2 servings a day  Whole grains: 6-8 servings a day  Extra-virgin olive oil: 3-4 servings a day  Limit red meat and sweets to only a few servings a month What are my food choices?  Mediterranean diet  Recommended  Grains: Whole-grain pasta. Brown rice. Bulgar wheat. Polenta. Couscous. Whole-wheat bread. [place].  Vegetables: Artichokes. Beets. Broccoli. Cabbage.  Carrots. Eggplant. Green beans. Chard. Kale. Spinach. Onions. Leeks. Peas. Squash. Tomatoes. Peppers. Radishes.  Fruits: Apples. Apricots. Avocado. Berries. Bananas. Cherries. Dates. Figs. Grapes. Lemons. Melon. Oranges. Peaches. Plums. Pomegranate.  Meats and other protein foods: Beans. Almonds. Sunflower seeds. Pine nuts. Peanuts. [place]. Salmon. Scallops. Shrimp. Newberry. Tilapia. Clams. Oysters. Eggs.  Dairy: Low-fat milk. Cheese. Greek yogurt.  Beverages: Water. Red wine. Herbal tea.  Fats and oils: Extra virgin olive oil. Avocado oil. Grape seed oil.  Sweets and desserts: [place] yogurt with honey. Baked apples. Poached pears. Trail mix.  Seasoning and other foods: Basil. Cilantro. Coriander. Cumin. Mint. Parsley. Sage. Rosemary. Tarragon. Garlic. Oregano. Thyme. Pepper. Balsalmic vinegar. Tahini. Hummus. Tomato sauce. Olives. Mushrooms.  Limit these  Grains: Prepackaged pasta or rice dishes. Prepackaged cereal with added sugar.  Vegetables: Deep fried potatoes (french fries).  Fruits: Fruit canned in syrup.  Meats and other protein foods: Beef. Pork. Lamb. Poultry with skin. Hot dogs. Berniece Salines.  Dairy: Ice cream. Sour cream. Whole milk.  Beverages: Juice. Sugar-sweetened soft drinks. Beer. Liquor and spirits.  Fats and oils: Butter. Canola oil. Vegetable oil. Beef fat (tallow). Lard.  Sweets and desserts: Cookies. Cakes. Pies. Candy.  Seasoning and other foods: Mayonnaise. Premade sauces and marinades.  The items listed may not be a complete list. Talk with your dietitian about what dietary choices are right for you. Summary  The Mediterranean diet includes both food and lifestyle choices.  Eat a variety of fresh fruits and vegetables, beans, nuts, seeds, and whole grains.  Limit the amount of red meat and sweets that you eat.  Talk with your health care provider about whether it is safe for you to drink red wine in moderation. This means 1 glass a day for nonpregnant women  and 2 glasses a day for men. A glass of wine equals 5 oz (150 mL). This information is not intended to replace advice given to you by your health care provider. Make sure you discuss any questions you have with your health care provider. Document Released: 01/22/2016 Document Revised: 02/24/2016 Document Reviewed: 01/22/2016 Elsevier Interactive Patient Education  2017 [employer].

## 2016-08-06 NOTE — Progress Notes (Signed)
Date:  08/06/2016   Name:  William [name]   DOB:  1971-05-29   MRN:  [id number]   Chief Complaint: Hypertension Hypertension  This is a new problem. The current episode started more than 1 month ago. The problem has been gradually improving since onset. The problem is controlled. Pertinent negatives include no chest pain, headaches, palpitations, peripheral edema or shortness of breath. Risk factors for coronary artery disease include dyslipidemia (low HDL). Past treatments include diuretics and angiotensin blockers. The current treatment provides significant improvement. Compliance problems include medication side effects (ED).   ED resolved when he held the medication for 2 days.    Review of Systems  Constitutional: Negative for appetite change, fatigue and unexpected weight change.  Eyes: Negative for visual disturbance.  Respiratory: Negative for cough, shortness of breath and wheezing.   Cardiovascular: Negative for chest pain, palpitations and leg swelling.  Gastrointestinal: Negative for abdominal pain, blood in stool and constipation.  Endocrine: Negative for polydipsia and polyuria.  Genitourinary: Negative for dysuria and hematuria.       ED since started diovan hct  Skin: Negative for color change and rash.  Neurological: Negative for tremors, numbness and headaches.  Psychiatric/Behavioral: Negative for dysphoric mood.    Patient Active Problem List   Diagnosis Date Noted  . Plantar fasciitis, bilateral 06/24/2016  . Family history of premature coronary heart disease 05/28/2015  . Essential hypertension 05/28/2015  . Insomnia, persistent 05/28/2015  . 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 Authorizing Provider  valsartan-hydrochlorothiazide (DIOVAN HCT) 160-12.5 MG tablet Take 1 tablet by mouth daily. 06/24/16  Yes Glean Hess, MD  naproxen (NAPROSYN) 500 MG tablet Take 1 tablet (500 mg total)  by mouth 2 (two) times daily with a meal. Patient not taking: Reported on 08/06/2016 08/19/15   Lorin Picket, PA-C  predniSONE (DELTASONE) 10 MG tablet Take 6 on day 1, 5 on day 2, 4 on day 3, 3 on day 4, 2 on day 5 and 1 on day 1 then stop. Patient not taking: Reported on 08/06/2016 06/24/16   Glean Hess, MD    No Known Allergies  Past Surgical History:  Procedure Laterality Date  . none      Social History  Substance Use Topics  . Smoking status: Never Smoker  . Smokeless tobacco: Never Used  . Alcohol use 0.6 oz/week    1 Standard drinks or equivalent per week     Medication list has been reviewed and updated.   Physical Exam  Constitutional: He is oriented to person, place, and time. He appears well-developed. No distress.  HENT:  Head: Normocephalic and atraumatic.  Neck: Normal range of motion. Neck supple. No thyromegaly present.  Cardiovascular: Normal rate, regular rhythm and normal heart sounds.   Pulmonary/Chest: Effort normal and breath sounds normal. No respiratory distress. He has no wheezes.  Musculoskeletal: He exhibits no edema or tenderness.  Neurological: He is alert and oriented to person, place, and time.  Skin: Skin is warm and dry. No rash noted.  Psychiatric: He has a normal mood and affect. His behavior is normal. Thought content normal.  Nursing note and vitals reviewed.   BP 125/82   Pulse 68   Ht 5\' 8"  (1.727 m)   Wt 246 lb (111.6 kg)   SpO2 97%   BMI 37.40 kg/m   Assessment and Plan: 1. Essential hypertension Improved control but intolerable  ED with Diovan hct Will change to amlodipine - amLODipine (NORVASC) 5 MG tablet; Take 1 tablet (5 mg total) by mouth daily.  Dispense: 90 tablet; Refill: 3  2. Dyslipidemia (high LDL; low HDL) Recommend continued diet - info on mediterranean diet given Begin quality fish oil supplement daily  Meds ordered this encounter  Medications  . amLODipine (NORVASC) 5 MG tablet    Sig: Take 1  tablet (5 mg total) by mouth daily.    Dispense:  90 tablet    Refill:  [place], MD [facility]  08/06/2016

## 2016-09-14 ENCOUNTER — Encounter: Payer: Self-pay | Admitting: Internal Medicine

## 2016-09-14 ENCOUNTER — Ambulatory Visit (INDEPENDENT_AMBULATORY_CARE_PROVIDER_SITE_OTHER): Payer: BLUE CROSS/BLUE SHIELD | Admitting: Internal Medicine

## 2016-09-14 VITALS — BP 134/84 | HR 64 | Ht 67.0 in | Wt 254.0 lb

## 2016-09-14 DIAGNOSIS — E785 Hyperlipidemia, unspecified: Secondary | ICD-10-CM

## 2016-09-14 DIAGNOSIS — E784 Other hyperlipidemia: Secondary | ICD-10-CM

## 2016-09-14 DIAGNOSIS — I10 Essential (primary) hypertension: Secondary | ICD-10-CM | POA: Diagnosis not present

## 2016-09-14 MED ORDER — HYDROCHLOROTHIAZIDE 25 MG PO TABS: 25.0000 mg | ORAL_TABLET | Freq: Every day | ORAL | 3 refills | Status: DC

## 2016-09-14 NOTE — Progress Notes (Signed)
Date:  09/14/2016   Name:  William [name]   DOB:  July 17, 1970   MRN:  [id number]   Chief Complaint: Hypertension (Ankles swollen and painful. Has not taking bp meds this morning.) and dyslipidemia Hypertension  This is a new problem. The current episode started more than 1 month ago. The problem has been gradually improving since onset. The problem is controlled. Associated symptoms include peripheral edema. Pertinent negatives include no chest pain, headaches, palpitations or shortness of breath. Past treatments include calcium channel blockers. The current treatment provides moderate improvement. Compliance problems include medication side effects (No ED but now edema).   Fam hx CAD - with mild dyslipidemia.  Discussed healthy diet and consider low dose statin therapy once HTN controlled.  Review of Systems  Constitutional: Negative for chills, fatigue and fever.  Respiratory: Negative for chest tightness, shortness of breath and wheezing.   Cardiovascular: Positive for leg swelling. Negative for chest pain and palpitations.  Gastrointestinal: Negative for constipation and diarrhea.  Neurological: Negative for dizziness and headaches.  Psychiatric/Behavioral: The patient is not nervous/anxious.     Patient Active Problem List   Diagnosis Date Noted  . Dyslipidemia (high LDL; low HDL) 08/06/2016  . Plantar fasciitis, bilateral 06/24/2016  . Family history of premature coronary heart disease 05/28/2015  . Essential hypertension 05/28/2015  . Insomnia, persistent 05/28/2015  . 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 Authorizing Provider  amLODipine (NORVASC) 5 MG tablet Take 1 tablet (5 mg total) by mouth daily. 08/06/16  Yes Glean Hess, MD  hydrochlorothiazide (HYDRODIURIL) 25 MG tablet Take 1 tablet (25 mg total) by mouth daily. 09/14/16   Glean Hess, MD    No Known Allergies  Past Surgical History:    Procedure Laterality Date  . none      Social History  Substance Use Topics  . Smoking status: Never Smoker  . Smokeless tobacco: Never Used  . Alcohol use 0.6 oz/week    1 Standard drinks or equivalent per week     Medication list has been reviewed and updated.   Physical Exam  Constitutional: He is oriented to person, place, and time. He appears well-developed. No distress.  HENT:  Head: Normocephalic and atraumatic.  Cardiovascular: Normal rate, regular rhythm and normal heart sounds.   1+ edema both anterior shins  Pulmonary/Chest: Effort normal and breath sounds normal. No respiratory distress. He has no wheezes.  Musculoskeletal: He exhibits edema and tenderness.  Neurological: He is alert and oriented to person, place, and time.  Skin: Skin is warm and dry. No rash noted.  Psychiatric: He has a normal mood and affect. His behavior is normal. Thought content normal.  Nursing note and vitals reviewed.   BP 134/84 (BP Location: Right Arm, Patient Position: Sitting, Cuff Size: Large)   Pulse 64   Ht 5\' 7"  (1.702 m)   Wt 254 lb (115.2 kg)   SpO2 96%   BMI 39.78 kg/m   Assessment and Plan: 1. Essential hypertension Improved but with edema Add HCTZ - call if not improved  2. Dyslipidemia (high LDL; low HDL) Consider statin therapy due to strong fam hx   Meds ordered this encounter  Medications  . hydrochlorothiazide (HYDRODIURIL) 25 MG tablet    Sig: Take 1 tablet (25 mg total) by mouth daily.    Dispense:  90 tablet    Refill:  3    Halina Maidens, MD  [facility]  09/14/2016

## 2017-01-18 ENCOUNTER — Encounter: Payer: Self-pay | Admitting: Internal Medicine

## 2017-01-18 ENCOUNTER — Ambulatory Visit (INDEPENDENT_AMBULATORY_CARE_PROVIDER_SITE_OTHER): Payer: BLUE CROSS/BLUE SHIELD | Admitting: Internal Medicine

## 2017-01-18 VITALS — BP 140/88 | HR 65 | Ht 67.0 in | Wt 245.0 lb

## 2017-01-18 DIAGNOSIS — E785 Hyperlipidemia, unspecified: Secondary | ICD-10-CM

## 2017-01-18 DIAGNOSIS — I10 Essential (primary) hypertension: Secondary | ICD-10-CM | POA: Diagnosis not present

## 2017-01-18 MED ORDER — AMLODIPINE BESYLATE 5 MG PO TABS: 5.0000 mg | ORAL_TABLET | Freq: Every day | ORAL | 3 refills | Status: DC

## 2017-01-18 MED ORDER — HYDROCHLOROTHIAZIDE 25 MG PO TABS: 25.0000 mg | ORAL_TABLET | Freq: Every day | ORAL | 3 refills | Status: DC

## 2017-01-18 NOTE — Progress Notes (Signed)
Date:  01/18/2017   Name:  William [name]   DOB:  1970-08-05   MRN:  [id number]   Chief Complaint: Hypertension (Been out of BP meds for a week- and wants to discuss getting fluid pill increased. Having swelling around ankles and feet. ) Hypertension  This is a chronic problem. The problem has been gradually improving since onset. The problem is controlled. Associated symptoms include peripheral edema. Pertinent negatives include no chest pain, headaches, palpitations or shortness of breath. Past treatments include calcium channel blockers and diuretics.      Review of Systems  Constitutional: Negative for chills, fatigue and fever.  Eyes: Negative for visual disturbance.  Respiratory: Negative for shortness of breath.   Cardiovascular: Negative for chest pain, palpitations and leg swelling.  Gastrointestinal: Negative for abdominal pain.  Musculoskeletal: Negative for gait problem.  Skin: Negative for color change and rash.  Neurological: Negative for dizziness and headaches.    Patient Active Problem List   Diagnosis Date Noted  . Dyslipidemia (high LDL; low HDL) 08/06/2016  . Plantar fasciitis, bilateral 06/24/2016  . Family history of premature coronary heart disease 05/28/2015  . Essential hypertension 05/28/2015  . Insomnia, persistent 05/28/2015  . 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 Authorizing Provider  amLODipine (NORVASC) 5 MG tablet Take 1 tablet (5 mg total) by mouth daily. 08/06/16  Yes Glean Hess, MD  hydrochlorothiazide (HYDRODIURIL) 25 MG tablet Take 1 tablet (25 mg total) by mouth daily. 09/14/16  Yes Glean Hess, MD    No Known Allergies  Past Surgical History:  Procedure Laterality Date  . none      Social History  Substance Use Topics  . Smoking status: Never Smoker  . Smokeless tobacco: Never Used  . Alcohol use 0.6 oz/week    1 Standard drinks or equivalent per  week     Medication list has been reviewed and updated.   Physical Exam  Constitutional: He is oriented to person, place, and time. He appears well-developed. No distress.  HENT:  Head: Normocephalic and atraumatic.  Neck: Normal range of motion. Neck supple.  Cardiovascular: Normal rate, regular rhythm and normal heart sounds.   Pulmonary/Chest: Effort normal and breath sounds normal. No respiratory distress. He has no wheezes.  Musculoskeletal: Normal range of motion.  Neurological: He is alert and oriented to person, place, and time.  Skin: Skin is warm and dry. No rash noted.  Psychiatric: He has a normal mood and affect. His behavior is normal. Thought content normal.  Nursing note and vitals reviewed.   BP 140/88   Pulse 65   Ht 5\' 7"  (1.702 m)   Wt 245 lb (111.1 kg)   SpO2 96%   BMI 38.37 kg/m   Assessment and Plan: 1. Essential hypertension Resume both medications and monitor BP - amLODipine (NORVASC) 5 MG tablet; Take 1 tablet (5 mg total) by mouth daily.  Dispense: 90 tablet; Refill: 3  2. Dyslipidemia (high LDL; low HDL) 10 yr risk 5% so no medication needed at this time.   Meds ordered this encounter  Medications  . hydrochlorothiazide (HYDRODIURIL) 25 MG tablet    Sig: Take 1 tablet (25 mg total) by mouth daily.    Dispense:  90 tablet    Refill:  3  . amLODipine (NORVASC) 5 MG tablet    Sig: Take 1 tablet (5 mg total) by mouth daily.    Dispense:  90 tablet    Refill:  Ida, MD [facility]  01/18/2017

## 2017-04-26 ENCOUNTER — Encounter: Payer: Self-pay | Admitting: Internal Medicine

## 2017-04-26 ENCOUNTER — Ambulatory Visit (INDEPENDENT_AMBULATORY_CARE_PROVIDER_SITE_OTHER): Payer: BLUE CROSS/BLUE SHIELD | Admitting: Internal Medicine

## 2017-04-26 VITALS — BP 142/86 | HR 71 | Temp 98.5°F | Ht 67.0 in | Wt 245.0 lb

## 2017-04-26 DIAGNOSIS — I10 Essential (primary) hypertension: Secondary | ICD-10-CM | POA: Diagnosis not present

## 2017-04-26 DIAGNOSIS — B309 Viral conjunctivitis, unspecified: Secondary | ICD-10-CM

## 2017-04-26 MED ORDER — NEOMYCIN-POLYMYXIN-DEXAMETH 3.5-10000-0.1 OP SUSP: 2.0000 [drp] | Freq: Four times a day (QID) | OPHTHALMIC | 0 refills | Status: DC

## 2017-04-26 NOTE — Progress Notes (Signed)
Date:  04/26/2017   Name:  William [name]   DOB:  05-26-1971   MRN:  [id number]   Chief Complaint: Conjunctivitis (This morning woke up and eye is swollen and pink. No gunk coming out of eye. no itching. ) Conjunctivitis   The current episode started today. The onset was sudden. The problem has been unchanged. The problem is moderate. Nothing relieves the symptoms. Nothing aggravates the symptoms. Associated symptoms include eye redness. Pertinent negatives include no fever, no decreased vision, no double vision, no eye itching, no photophobia, no sore throat, no swollen glands, no eye discharge and no eye pain.  Hypertension  This is a chronic problem. The problem is unchanged. The problem is controlled. Associated symptoms include peripheral edema. Pertinent negatives include no chest pain or shortness of breath. Past treatments include calcium channel blockers and diuretics. The current treatment provides significant improvement.      Review of Systems  Constitutional: Negative for chills, fatigue and fever.  HENT: Negative for sore throat.   Eyes: Positive for redness. Negative for double vision, photophobia, pain, discharge and itching.  Respiratory: Negative for chest tightness and shortness of breath.   Cardiovascular: Negative for chest pain.    Patient Active Problem List   Diagnosis Date Noted  . Dyslipidemia (high LDL; low HDL) 08/06/2016  . Plantar fasciitis, bilateral 06/24/2016  . Family history of premature coronary heart disease 05/28/2015  . Essential hypertension 05/28/2015  . Insomnia, persistent 05/28/2015  . 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 Authorizing Provider  amLODipine (NORVASC) 5 MG tablet Take 1 tablet (5 mg total) by mouth daily. 01/18/17   Glean Hess, MD  hydrochlorothiazide (HYDRODIURIL) 25 MG tablet Take 1 tablet (25 mg total) by mouth daily. 01/18/17   Glean Hess, MD     No Known Allergies  Past Surgical History:  Procedure Laterality Date  . none      Social History   Tobacco Use  . Smoking status: Never Smoker  . Smokeless tobacco: Never Used  Substance Use Topics  . Alcohol use: Yes    Alcohol/week: 0.6 oz    Types: 1 Standard drinks or equivalent per week  . Drug use: No     Medication list has been reviewed and updated.  PHQ 2/9 Scores 06/24/2016  PHQ - 2 Score 0    Physical Exam  Constitutional: He is oriented to person, place, and time. He appears well-developed. No distress.  HENT:  Head: Normocephalic and atraumatic.  Eyes: EOM are normal. Pupils are equal, round, and reactive to light. Right eye exhibits chemosis. Left eye exhibits chemosis. Right conjunctiva is injected. Left conjunctiva is injected.  Pulmonary/Chest: Effort normal. No respiratory distress.  Musculoskeletal: Normal range of motion. He exhibits edema (trace ankle).  Lymphadenopathy:    He has no cervical adenopathy.  Neurological: He is alert and oriented to person, place, and time.  Skin: Skin is warm and dry. No rash noted.  Psychiatric: He has a normal mood and affect. His behavior is normal. Thought content normal.  Nursing note and vitals reviewed.   BP (!) 148/90   Pulse 71   Temp 98.5 F (36.9 C) (Oral)   Ht 5\' 7"  (1.702 m)   Wt 245 lb (111.1 kg)   SpO2 96%   BMI 38.37 kg/m   Assessment and Plan: 1. Acute viral conjunctivitis of both eyes Warm compresses and rest overnight -  neomycin-polymyxin b-dexamethasone (MAXITROL) 3.5-10000-0.1 SUSP; Place 2 drops every 6 (six) hours into both eyes.  Dispense: 5 mL; Refill: 0  2. Essential hypertension May increase HCTZ to 50 mg per day   Meds ordered this encounter  Medications  . neomycin-polymyxin b-dexamethasone (MAXITROL) 3.5-10000-0.1 SUSP    Sig: Place 2 drops every 6 (six) hours into both eyes.    Dispense:  5 mL    Refill:  0    Partially dictated using Editor, commissioning. Any  errors are unintentional.  Halina Maidens, MD [facility]  04/26/2017

## 2017-05-29 ENCOUNTER — Other Ambulatory Visit: Payer: Self-pay | Admitting: Internal Medicine

## 2017-05-29 DIAGNOSIS — M722 Plantar fascial fibromatosis: Secondary | ICD-10-CM

## 2017-07-12 ENCOUNTER — Encounter: Payer: Self-pay | Admitting: Internal Medicine

## 2017-07-12 ENCOUNTER — Ambulatory Visit (INDEPENDENT_AMBULATORY_CARE_PROVIDER_SITE_OTHER): Payer: BLUE CROSS/BLUE SHIELD | Admitting: Internal Medicine

## 2017-07-12 VITALS — BP 124/84 | HR 66 | Ht 67.0 in | Wt 252.0 lb

## 2017-07-12 DIAGNOSIS — G4700 Insomnia, unspecified: Secondary | ICD-10-CM

## 2017-07-12 DIAGNOSIS — Z0001 Encounter for general adult medical examination with abnormal findings: Secondary | ICD-10-CM | POA: Diagnosis not present

## 2017-07-12 DIAGNOSIS — I10 Essential (primary) hypertension: Secondary | ICD-10-CM | POA: Diagnosis not present

## 2017-07-12 DIAGNOSIS — E785 Hyperlipidemia, unspecified: Secondary | ICD-10-CM

## 2017-07-12 DIAGNOSIS — Z125 Encounter for screening for malignant neoplasm of prostate: Secondary | ICD-10-CM

## 2017-07-12 DIAGNOSIS — Z0000 Encounter for general adult medical examination without abnormal findings: Secondary | ICD-10-CM

## 2017-07-12 LAB — POCT URINALYSIS DIPSTICK
Bilirubin, UA: NEGATIVE
Blood, UA: NEGATIVE
GLUCOSE UA: NEGATIVE
KETONES UA: NEGATIVE
Leukocytes, UA: NEGATIVE
NITRITE UA: NEGATIVE
PROTEIN UA: NEGATIVE
Spec Grav, UA: <=1.005 — AB (ref 1.010–1.025)
Urobilinogen, UA: 0.2 E.U./dL
pH, UA: 7 (ref 5.0–8.0)

## 2017-07-12 NOTE — Patient Instructions (Signed)
DASH Eating Plan DASH stands for "Dietary Approaches to Stop Hypertension." The DASH eating plan is a healthy eating plan that has been shown to reduce high blood pressure (hypertension). It may also reduce your risk for type 2 diabetes, heart disease, and stroke. The DASH eating plan may also help with weight loss. What are tips for following this plan? General guidelines  Avoid eating more than 2,300 mg (milligrams) of salt (sodium) a day. If you have hypertension, you may need to reduce your sodium intake to 1,500 mg a day.  Limit alcohol intake to no more than 1 drink a day for nonpregnant women and 2 drinks a day for men. One drink equals 12 oz of beer, 5 oz of wine, or 1 oz of hard liquor.  Work with your health care provider to maintain a healthy body weight or to lose weight. Ask what an ideal weight is for you.  Get at least 30 minutes of exercise that causes your heart to beat faster (aerobic exercise) most days of the week. Activities may include walking, swimming, or biking.  Work with your health care provider or diet and nutrition specialist (dietitian) to adjust your eating plan to your individual calorie needs. Reading food labels  Check food labels for the amount of sodium per serving. Choose foods with less than 5 percent of the Daily Value of sodium. Generally, foods with less than 300 mg of sodium per serving fit into this eating plan.  To find whole grains, look for the word "whole" as the first word in the ingredient list. Shopping  Buy products labeled as "low-sodium" or "no salt added."  Buy fresh foods. Avoid canned foods and premade or frozen meals. Cooking  Avoid adding salt when cooking. Use salt-free seasonings or herbs instead of table salt or sea salt. Check with your health care provider or pharmacist before using salt substitutes.  Do not fry foods. Cook foods using healthy methods such as baking, boiling, grilling, and broiling instead.  Cook with  heart-healthy oils, such as olive, canola, soybean, or sunflower oil. Meal planning   Eat a balanced diet that includes: ? 5 or more servings of fruits and vegetables each day. At each meal, try to fill half of your plate with fruits and vegetables. ? Up to 6-8 servings of whole grains each day. ? Less than 6 oz of lean meat, poultry, or fish each day. A 3-oz serving of meat is about the same size as a deck of cards. One egg equals 1 oz. ? 2 servings of low-fat dairy each day. ? A serving of nuts, seeds, or beans 5 times each week. ? Heart-healthy fats. Healthy fats called Omega-3 fatty acids are found in foods such as flaxseeds and coldwater fish, like sardines, salmon, and mackerel.  Limit how much you eat of the following: ? Canned or prepackaged foods. ? Food that is high in trans fat, such as fried foods. ? Food that is high in saturated fat, such as fatty meat. ? Sweets, desserts, sugary drinks, and other foods with added sugar. ? Full-fat dairy products.  Do not salt foods before eating.  Try to eat at least 2 vegetarian meals each week.  Eat more home-cooked food and less restaurant, buffet, and fast food.  When eating at a restaurant, ask that your food be prepared with less salt or no salt, if possible. What foods are recommended? The items listed may not be a complete list. Talk with your dietitian about what   dietary choices are best for you. Grains Whole-grain or whole-wheat bread. Whole-grain or whole-wheat pasta. Brown rice. Oatmeal. Quinoa. Bulgur. Whole-grain and low-sodium cereals. Pita bread. Low-fat, low-sodium crackers. Whole-wheat flour tortillas. Vegetables Fresh or frozen vegetables (raw, steamed, roasted, or grilled). Low-sodium or reduced-sodium tomato and vegetable juice. Low-sodium or reduced-sodium tomato sauce and tomato paste. Low-sodium or reduced-sodium canned vegetables. Fruits All fresh, dried, or frozen fruit. Canned fruit in natural juice (without  added sugar). Meat and other protein foods Skinless chicken or turkey. Ground chicken or turkey. Pork with fat trimmed off. Fish and seafood. Egg whites. Dried beans, peas, or lentils. Unsalted nuts, nut butters, and seeds. Unsalted canned beans. Lean cuts of beef with fat trimmed off. Low-sodium, lean deli meat. Dairy Low-fat (1%) or fat-free (skim) milk. Fat-free, low-fat, or reduced-fat cheeses. Nonfat, low-sodium ricotta or cottage cheese. Low-fat or nonfat yogurt. Low-fat, low-sodium cheese. Fats and oils Soft margarine without trans fats. Vegetable oil. Low-fat, reduced-fat, or light mayonnaise and salad dressings (reduced-sodium). Canola, safflower, olive, soybean, and sunflower oils. Avocado. Seasoning and other foods Herbs. Spices. Seasoning mixes without salt. Unsalted popcorn and pretzels. Fat-free sweets. What foods are not recommended? The items listed may not be a complete list. Talk with your dietitian about what dietary choices are best for you. Grains Baked goods made with fat, such as croissants, muffins, or some breads. Dry pasta or rice meal packs. Vegetables Creamed or fried vegetables. Vegetables in a cheese sauce. Regular canned vegetables (not low-sodium or reduced-sodium). Regular canned tomato sauce and paste (not low-sodium or reduced-sodium). Regular tomato and vegetable juice (not low-sodium or reduced-sodium). Pickles. Olives. Fruits Canned fruit in a light or heavy syrup. Fried fruit. Fruit in cream or butter sauce. Meat and other protein foods Fatty cuts of meat. Ribs. Fried meat. Bacon. Sausage. Bologna and other processed lunch meats. Salami. Fatback. Hotdogs. Bratwurst. Salted nuts and seeds. Canned beans with added salt. Canned or smoked fish. Whole eggs or egg yolks. Chicken or turkey with skin. Dairy Whole or 2% milk, cream, and half-and-half. Whole or full-fat cream cheese. Whole-fat or sweetened yogurt. Full-fat cheese. Nondairy creamers. Whipped toppings.  Processed cheese and cheese spreads. Fats and oils Butter. Stick margarine. Lard. Shortening. Ghee. Bacon fat. Tropical oils, such as coconut, palm kernel, or palm oil. Seasoning and other foods Salted popcorn and pretzels. Onion salt, garlic salt, seasoned salt, table salt, and sea salt. Worcestershire sauce. Tartar sauce. Barbecue sauce. Teriyaki sauce. Soy sauce, including reduced-sodium. Steak sauce. Canned and packaged gravies. Fish sauce. Oyster sauce. Cocktail sauce. Horseradish that you find on the shelf. Ketchup. Mustard. Meat flavorings and tenderizers. Bouillon cubes. Hot sauce and Tabasco sauce. Premade or packaged marinades. Premade or packaged taco seasonings. Relishes. Regular salad dressings. Where to find more information:  National Heart, Lung, and Blood Institute: www.nhlbi.nih.gov  American Heart Association: www.heart.org Summary  The DASH eating plan is a healthy eating plan that has been shown to reduce high blood pressure (hypertension). It may also reduce your risk for type 2 diabetes, heart disease, and stroke.  With the DASH eating plan, you should limit salt (sodium) intake to 2,300 mg a day. If you have hypertension, you may need to reduce your sodium intake to 1,500 mg a day.  When on the DASH eating plan, aim to eat more fresh fruits and vegetables, whole grains, lean proteins, low-fat dairy, and heart-healthy fats.  Work with your health care provider or diet and nutrition specialist (dietitian) to adjust your eating plan to your individual   calorie needs. This information is not intended to replace advice given to you by your health care provider. Make sure you discuss any questions you have with your health care provider. Document Released: 05/20/2011 Document Revised: 05/24/2016 Document Reviewed: 05/24/2016 Elsevier Interactive Patient Education  2018 Elsevier Inc.  

## 2017-07-12 NOTE — Progress Notes (Signed)
Date:  07/12/2017   Name:  William [name]   DOB:  [birth year]   MRN:  [id number]   Chief Complaint: Annual Exam and Hypertension William [name] is a 47 y.o. male who presents today for his Complete Annual Exam. He feels well. He reports exercising none. He reports he is sleeping fairly well.   Hypertension  This is a chronic problem. The problem is unchanged. The problem is controlled. Pertinent negatives include no chest pain, headaches, palpitations or shortness of breath. Past treatments include calcium channel blockers and diuretics. The current treatment provides significant improvement. There are no compliance problems.       Review of Systems  Constitutional: Negative for appetite change, chills, diaphoresis, fatigue and unexpected weight change.  HENT: Negative for hearing loss, tinnitus, trouble swallowing and voice change.   Eyes: Negative for visual disturbance.  Respiratory: Negative for choking, shortness of breath and wheezing.   Cardiovascular: Negative for chest pain, palpitations and leg swelling.  Gastrointestinal: Negative for abdominal pain, blood in stool, constipation and diarrhea.  Genitourinary: Negative for difficulty urinating, dysuria and frequency.  Musculoskeletal: Positive for arthralgias (right ankle and heel). Negative for back pain and myalgias.  Skin: Negative for color change and rash.  Neurological: Negative for dizziness, syncope and headaches.  Hematological: Negative for adenopathy.  Psychiatric/Behavioral: Positive for sleep disturbance (life long unchanged). Negative for dysphoric mood.    Patient Active Problem List   Diagnosis Date Noted  . Dyslipidemia (high LDL; low HDL) 08/06/2016  . Plantar fasciitis, bilateral 06/24/2016  . Family history of premature coronary heart disease 05/28/2015  . Essential hypertension 05/28/2015  . Insomnia, persistent 05/28/2015  . 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 Authorizing Provider  amLODipine (NORVASC) 5 MG tablet Take 1 tablet (5 mg total) by mouth daily. 01/18/17  Yes Glean Hess, MD  hydrochlorothiazide (HYDRODIURIL) 25 MG tablet Take 1 tablet (25 mg total) by mouth daily. 01/18/17  Yes Glean Hess, MD  neomycin-polymyxin b-dexamethasone (MAXITROL) 3.5-10000-0.1 SUSP Place 2 drops every 6 (six) hours into both eyes. 04/26/17  Yes Glean Hess, MD    No Known Allergies  Past Surgical History:  Procedure Laterality Date  . none      Social History   Tobacco Use  . Smoking status: Never Smoker  . Smokeless tobacco: Never Used  Substance Use Topics  . Alcohol use: Yes    Alcohol/week: 0.6 oz    Types: 1 Standard drinks or equivalent per week  . Drug use: No     Medication list has been reviewed and updated.  PHQ 2/9 Scores 07/12/2017 06/24/2016  PHQ - 2 Score 0 0    Physical Exam  Constitutional: He is oriented to person, place, and time. He appears well-developed and well-nourished.  HENT:  Head: Normocephalic.  Right Ear: Tympanic membrane, external ear and ear canal normal.  Left Ear: Tympanic membrane, external ear and ear canal normal.  Nose: Nose normal.  Mouth/Throat: Uvula is midline and oropharynx is clear and moist.  Eyes: Conjunctivae and EOM are normal. Pupils are equal, round, and reactive to light.  Neck: Normal range of motion. Neck supple. Carotid bruit is not present. No thyromegaly present.  Cardiovascular: Normal rate, regular rhythm, normal heart sounds and intact distal pulses.  Pulmonary/Chest: Effort normal and breath sounds normal. He has no wheezes. Right breast exhibits no mass. Left breast exhibits no mass.  Abdominal: Soft. Normal appearance and bowel sounds are normal. There is no hepatosplenomegaly. There is no tenderness.  Musculoskeletal:  Tender medial ankle and heel - felt to be PF  Lymphadenopathy:    He has no cervical adenopathy.   Neurological: He is alert and oriented to person, place, and time. He has normal reflexes.  Skin: Skin is warm, dry and intact.  Psychiatric: He has a normal mood and affect. His speech is normal and behavior is normal. Judgment and thought content normal.  Nursing note and vitals reviewed.   BP 124/84   Pulse 66   Ht 5\' 7"  (1.702 m)   Wt 252 lb (114.3 kg)   SpO2 99%   BMI 39.47 kg/m   Assessment and Plan: 1. Annual physical exam Normal exam except for weight Discussed taking fish oil supplements - POCT urinalysis dipstick  2. Essential hypertension controlled - CBC with Differential/Platelet - Comprehensive metabolic panel  3. Dyslipidemia (high LDL; low HDL) Will advise on medications - Lipid panel  4. Insomnia, persistent unchanged  5. Prostate cancer screening DRE deferred  - PSA   No orders of the defined types were placed in this encounter.   Partially dictated using Editor, commissioning. Any errors are unintentional.  Halina Maidens, MD [facility]  07/12/2017

## 2017-07-13 LAB — COMPREHENSIVE METABOLIC PANEL
ALK PHOS: 62 IU/L (ref 39–117)
ALT: 53 IU/L — AB (ref 0–44)
AST: 31 IU/L (ref 0–40)
Albumin/Globulin Ratio: 1.8 (ref 1.2–2.2)
Albumin: 4.6 g/dL (ref 3.5–5.5)
BILIRUBIN TOTAL: 0.7 mg/dL (ref 0.0–1.2)
BUN / CREAT RATIO: 14 (ref 9–20)
BUN: 14 mg/dL (ref 6–24)
CHLORIDE: 97 mmol/L (ref 96–106)
CO2: 25 mmol/L (ref 20–29)
Calcium: 8.6 mg/dL — ABNORMAL LOW (ref 8.7–10.2)
Creatinine, Ser: 0.99 mg/dL (ref 0.76–1.27)
GFR calc Af Amer: 104 mL/min/{1.73_m2} (ref >59)
GFR calc non Af Amer: 90 mL/min/{1.73_m2} (ref >59)
Globulin, Total: 2.6 g/dL (ref 1.5–4.5)
Glucose: 113 mg/dL — ABNORMAL HIGH (ref 65–99)
Potassium: 4.5 mmol/L (ref 3.5–5.2)
Sodium: 136 mmol/L (ref 134–144)
Total Protein: 7.2 g/dL (ref 6.0–8.5)

## 2017-07-13 LAB — CBC WITH DIFFERENTIAL/PLATELET
BASOS: 1 %
Basophils Absolute: 0.1 10*3/uL (ref 0.0–0.2)
EOS (ABSOLUTE): 0.5 10*3/uL — ABNORMAL HIGH (ref 0.0–0.4)
EOS: 7 %
Hematocrit: 48.3 % (ref 37.5–51.0)
Hemoglobin: 17 g/dL (ref 13.0–17.7)
IMMATURE GRANS (ABS): 0 10*3/uL (ref 0.0–0.1)
IMMATURE GRANULOCYTES: 0 %
LYMPHS: 30 %
Lymphocytes Absolute: 2 10*3/uL (ref 0.7–3.1)
MCH: 31.8 pg (ref 26.6–33.0)
MCHC: 35.2 g/dL (ref 31.5–35.7)
MCV: 90 fL (ref 79–97)
MONOCYTES: 11 %
MONOS ABS: 0.8 10*3/uL (ref 0.1–0.9)
NEUTROS PCT: 51 %
Neutrophils Absolute: 3.6 10*3/uL (ref 1.4–7.0)
PLATELETS: 205 10*3/uL (ref 150–379)
RBC: 5.34 x10E6/uL (ref 4.14–5.80)
RDW: 12.8 % (ref 12.3–15.4)
WBC: 6.9 10*3/uL (ref 3.4–10.8)

## 2017-07-13 LAB — LIPID PANEL
CHOLESTEROL TOTAL: 203 mg/dL — AB (ref 100–199)
Chol/HDL Ratio: 5.2 ratio — ABNORMAL HIGH (ref 0.0–5.0)
HDL: 39 mg/dL — ABNORMAL LOW (ref >39)
LDL Calculated: 141 mg/dL — ABNORMAL HIGH (ref 0–99)
TRIGLYCERIDES: 114 mg/dL (ref 0–149)
VLDL CHOLESTEROL CAL: 23 mg/dL (ref 5–40)

## 2017-07-13 LAB — PSA: Prostate Specific Ag, Serum: 1.4 ng/mL (ref 0.0–4.0)

## 2018-01-10 ENCOUNTER — Encounter: Payer: Self-pay | Admitting: Internal Medicine

## 2018-01-10 ENCOUNTER — Ambulatory Visit (INDEPENDENT_AMBULATORY_CARE_PROVIDER_SITE_OTHER): Payer: BLUE CROSS/BLUE SHIELD | Admitting: Internal Medicine

## 2018-01-10 VITALS — BP 130/84 | HR 69 | Ht 67.0 in | Wt 247.0 lb

## 2018-01-10 DIAGNOSIS — I10 Essential (primary) hypertension: Secondary | ICD-10-CM

## 2018-01-10 MED ORDER — HYDROCHLOROTHIAZIDE 25 MG PO TABS: 25.0000 mg | ORAL_TABLET | Freq: Every day | ORAL | 3 refills | Status: DC

## 2018-01-10 MED ORDER — AMLODIPINE BESYLATE 5 MG PO TABS: 5.0000 mg | ORAL_TABLET | Freq: Every day | ORAL | 3 refills | Status: DC

## 2018-01-10 NOTE — Patient Instructions (Signed)
DASH Eating Plan DASH stands for "Dietary Approaches to Stop Hypertension." The DASH eating plan is a healthy eating plan that has been shown to reduce high blood pressure (hypertension). It may also reduce your risk for type 2 diabetes, heart disease, and stroke. The DASH eating plan may also help with weight loss. What are tips for following this plan? General guidelines  Avoid eating more than 2,300 mg (milligrams) of salt (sodium) a day. If you have hypertension, you may need to reduce your sodium intake to 1,500 mg a day.  Limit alcohol intake to no more than 1 drink a day for nonpregnant women and 2 drinks a day for men. One drink equals 12 oz of beer, 5 oz of wine, or 1 oz of hard liquor.  Work with your health care provider to maintain a healthy body weight or to lose weight. Ask what an ideal weight is for you.  Get at least 30 minutes of exercise that causes your heart to beat faster (aerobic exercise) most days of the week. Activities may include walking, swimming, or biking.  Work with your health care provider or diet and nutrition specialist (dietitian) to adjust your eating plan to your individual calorie needs. Reading food labels  Check food labels for the amount of sodium per serving. Choose foods with less than 5 percent of the Daily Value of sodium. Generally, foods with less than 300 mg of sodium per serving fit into this eating plan.  To find whole grains, look for the word "whole" as the first word in the ingredient list. Shopping  Buy products labeled as "low-sodium" or "no salt added."  Buy fresh foods. Avoid canned foods and premade or frozen meals. Cooking  Avoid adding salt when cooking. Use salt-free seasonings or herbs instead of table salt or sea salt. Check with your health care provider or pharmacist before using salt substitutes.  Do not fry foods. Cook foods using healthy methods such as baking, boiling, grilling, and broiling instead.  Cook with  heart-healthy oils, such as olive, canola, soybean, or sunflower oil. Meal planning   Eat a balanced diet that includes: ? 5 or more servings of fruits and vegetables each day. At each meal, try to fill half of your plate with fruits and vegetables. ? Up to 6-8 servings of whole grains each day. ? Less than 6 oz of lean meat, poultry, or fish each day. A 3-oz serving of meat is about the same size as a deck of cards. One egg equals 1 oz. ? 2 servings of low-fat dairy each day. ? A serving of nuts, seeds, or beans 5 times each week. ? Heart-healthy fats. Healthy fats called Omega-3 fatty acids are found in foods such as flaxseeds and coldwater fish, like sardines, salmon, and mackerel.  Limit how much you eat of the following: ? Canned or prepackaged foods. ? Food that is high in trans fat, such as fried foods. ? Food that is high in saturated fat, such as fatty meat. ? Sweets, desserts, sugary drinks, and other foods with added sugar. ? Full-fat dairy products.  Do not salt foods before eating.  Try to eat at least 2 vegetarian meals each week.  Eat more home-cooked food and less restaurant, buffet, and fast food.  When eating at a restaurant, ask that your food be prepared with less salt or no salt, if possible. What foods are recommended? The items listed may not be a complete list. Talk with your dietitian about what   dietary choices are best for you. Grains Whole-grain or whole-wheat bread. Whole-grain or whole-wheat pasta. Brown rice. Oatmeal. Quinoa. Bulgur. Whole-grain and low-sodium cereals. Pita bread. Low-fat, low-sodium crackers. Whole-wheat flour tortillas. Vegetables Fresh or frozen vegetables (raw, steamed, roasted, or grilled). Low-sodium or reduced-sodium tomato and vegetable juice. Low-sodium or reduced-sodium tomato sauce and tomato paste. Low-sodium or reduced-sodium canned vegetables. Fruits All fresh, dried, or frozen fruit. Canned fruit in natural juice (without  added sugar). Meat and other protein foods Skinless chicken or turkey. Ground chicken or turkey. Pork with fat trimmed off. Fish and seafood. Egg whites. Dried beans, peas, or lentils. Unsalted nuts, nut butters, and seeds. Unsalted canned beans. Lean cuts of beef with fat trimmed off. Low-sodium, lean deli meat. Dairy Low-fat (1%) or fat-free (skim) milk. Fat-free, low-fat, or reduced-fat cheeses. Nonfat, low-sodium ricotta or cottage cheese. Low-fat or nonfat yogurt. Low-fat, low-sodium cheese. Fats and oils Soft margarine without trans fats. Vegetable oil. Low-fat, reduced-fat, or light mayonnaise and salad dressings (reduced-sodium). Canola, safflower, olive, soybean, and sunflower oils. Avocado. Seasoning and other foods Herbs. Spices. Seasoning mixes without salt. Unsalted popcorn and pretzels. Fat-free sweets. What foods are not recommended? The items listed may not be a complete list. Talk with your dietitian about what dietary choices are best for you. Grains Baked goods made with fat, such as croissants, muffins, or some breads. Dry pasta or rice meal packs. Vegetables Creamed or fried vegetables. Vegetables in a cheese sauce. Regular canned vegetables (not low-sodium or reduced-sodium). Regular canned tomato sauce and paste (not low-sodium or reduced-sodium). Regular tomato and vegetable juice (not low-sodium or reduced-sodium). Pickles. Olives. Fruits Canned fruit in a light or heavy syrup. Fried fruit. Fruit in cream or butter sauce. Meat and other protein foods Fatty cuts of meat. Ribs. Fried meat. Bacon. Sausage. Bologna and other processed lunch meats. Salami. Fatback. Hotdogs. Bratwurst. Salted nuts and seeds. Canned beans with added salt. Canned or smoked fish. Whole eggs or egg yolks. Chicken or turkey with skin. Dairy Whole or 2% milk, cream, and half-and-half. Whole or full-fat cream cheese. Whole-fat or sweetened yogurt. Full-fat cheese. Nondairy creamers. Whipped toppings.  Processed cheese and cheese spreads. Fats and oils Butter. Stick margarine. Lard. Shortening. Ghee. Bacon fat. Tropical oils, such as coconut, palm kernel, or palm oil. Seasoning and other foods Salted popcorn and pretzels. Onion salt, garlic salt, seasoned salt, table salt, and sea salt. Worcestershire sauce. Tartar sauce. Barbecue sauce. Teriyaki sauce. Soy sauce, including reduced-sodium. Steak sauce. Canned and packaged gravies. Fish sauce. Oyster sauce. Cocktail sauce. Horseradish that you find on the shelf. Ketchup. Mustard. Meat flavorings and tenderizers. Bouillon cubes. Hot sauce and Tabasco sauce. Premade or packaged marinades. Premade or packaged taco seasonings. Relishes. Regular salad dressings. Where to find more information:  National Heart, Lung, and Blood Institute: www.nhlbi.nih.gov  American Heart Association: www.heart.org Summary  The DASH eating plan is a healthy eating plan that has been shown to reduce high blood pressure (hypertension). It may also reduce your risk for type 2 diabetes, heart disease, and stroke.  With the DASH eating plan, you should limit salt (sodium) intake to 2,300 mg a day. If you have hypertension, you may need to reduce your sodium intake to 1,500 mg a day.  When on the DASH eating plan, aim to eat more fresh fruits and vegetables, whole grains, lean proteins, low-fat dairy, and heart-healthy fats.  Work with your health care provider or diet and nutrition specialist (dietitian) to adjust your eating plan to your individual   calorie needs. This information is not intended to replace advice given to you by your health care provider. Make sure you discuss any questions you have with your health care provider. Document Released: 05/20/2011 Document Revised: 05/24/2016 Document Reviewed: 05/24/2016 Elsevier Interactive Patient Education  2018 Elsevier Inc.  

## 2018-01-10 NOTE — Progress Notes (Signed)
Date:  01/10/2018   Name:  William [name]   DOB:  1970-09-11   MRN:  [id number]   Chief Complaint: Hypertension Hypertension  This is a chronic problem. The problem is unchanged. The problem is controlled. Pertinent negatives include no chest pain, headaches, palpitations or shortness of breath. There are no associated agents to hypertension. Past treatments include calcium channel blockers and diuretics. The current treatment provides significant improvement. Compliance problems include exercise.    Lab Results  Component Value Date   CREATININE 0.99 07/12/2017   BUN 14 07/12/2017   NA 136 07/12/2017   K 4.5 07/12/2017   CL 97 07/12/2017   CO2 25 07/12/2017      Review of Systems  Constitutional: Negative for chills, fatigue, fever and unexpected weight change (has lost a few pounds with diet change).  Eyes: Negative for visual disturbance.  Respiratory: Negative for cough, chest tightness, shortness of breath and wheezing.   Cardiovascular: Negative for chest pain, palpitations and leg swelling.  Gastrointestinal: Negative for abdominal pain, constipation and diarrhea.  Genitourinary: Negative for dysuria and hematuria.  Musculoskeletal: Negative for arthralgias and joint swelling.  Neurological: Negative for dizziness and headaches.  Psychiatric/Behavioral: Negative for dysphoric mood and sleep disturbance.    Patient Active Problem List   Diagnosis Date Noted  . Dyslipidemia (high LDL; low HDL) 08/06/2016  . Plantar fasciitis, bilateral 06/24/2016  . Family history of premature coronary heart disease 05/28/2015  . Essential hypertension 05/28/2015  . Insomnia, persistent 05/28/2015  . 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 Authorizing Provider  amLODipine (NORVASC) 5 MG tablet Take 1 tablet (5 mg total) by mouth daily. 01/18/17   Glean Hess, MD  hydrochlorothiazide (HYDRODIURIL) 25 MG tablet  Take 1 tablet (25 mg total) by mouth daily. 01/18/17   Glean Hess, MD  neomycin-polymyxin b-dexamethasone (MAXITROL) 3.5-10000-0.1 SUSP Place 2 drops every 6 (six) hours into both eyes. 04/26/17   Glean Hess, MD    No Known Allergies  Past Surgical History:  Procedure Laterality Date  . none      Social History   Tobacco Use  . Smoking status: Never Smoker  . Smokeless tobacco: Never Used  Substance Use Topics  . Alcohol use: Yes    Alcohol/week: 0.6 oz    Types: 1 Standard drinks or equivalent per week  . Drug use: No     Medication list has been reviewed and updated.  Current Meds  Medication Sig  . amLODipine (NORVASC) 5 MG tablet Take 1 tablet (5 mg total) by mouth daily.  . hydrochlorothiazide (HYDRODIURIL) 25 MG tablet Take 1 tablet (25 mg total) by mouth daily.    PHQ 2/9 Scores 07/12/2017 06/24/2016  PHQ - 2 Score 0 0    Physical Exam  Constitutional: He is oriented to person, place, and time. He appears well-developed. No distress.  HENT:  Head: Normocephalic and atraumatic.  Neck: Carotid bruit is not present.  Cardiovascular: Normal rate, regular rhythm and normal heart sounds.  Pulmonary/Chest: Effort normal and breath sounds normal. No respiratory distress.  Musculoskeletal: Normal range of motion. He exhibits no edema or tenderness.  Neurological: He is alert and oriented to person, place, and time.  Skin: Skin is warm and dry. No rash noted.  Psychiatric: He has a normal mood and affect. His behavior is normal. Thought content normal.  Nursing note and vitals reviewed.   BP 130/84  Pulse 69   Ht 5\' 7"  (1.702 m)   Wt 247 lb (112 kg)   SpO2 96%   BMI 38.69 kg/m   Assessment and Plan: 1. Essential hypertension Controlled, continue current medications Encouraged regular aerobic exercise, continue diet changes - hydrochlorothiazide (HYDRODIURIL) 25 MG tablet; Take 1 tablet (25 mg total) by mouth daily.  Dispense: 90 tablet; Refill:  3 - amLODipine (NORVASC) 5 MG tablet; Take 1 tablet (5 mg total) by mouth daily.  Dispense: 90 tablet; Refill: 3   Meds ordered this encounter  Medications  . hydrochlorothiazide (HYDRODIURIL) 25 MG tablet    Sig: Take 1 tablet (25 mg total) by mouth daily.    Dispense:  90 tablet    Refill:  3  . amLODipine (NORVASC) 5 MG tablet    Sig: Take 1 tablet (5 mg total) by mouth daily.    Dispense:  90 tablet    Refill:  3    Partially dictated using Editor, commissioning. Any errors are unintentional.  Halina Maidens, MD [facility]  01/10/2018

## 2018-01-17 ENCOUNTER — Ambulatory Visit: Payer: BLUE CROSS/BLUE SHIELD | Admitting: Internal Medicine

## 2018-07-18 ENCOUNTER — Encounter: Payer: Self-pay | Admitting: Internal Medicine

## 2018-07-18 ENCOUNTER — Ambulatory Visit (INDEPENDENT_AMBULATORY_CARE_PROVIDER_SITE_OTHER): Payer: BLUE CROSS/BLUE SHIELD | Admitting: Internal Medicine

## 2018-07-18 ENCOUNTER — Other Ambulatory Visit: Payer: Self-pay

## 2018-07-18 VITALS — BP 120/78 | HR 61 | Ht 67.0 in | Wt 249.0 lb

## 2018-07-18 DIAGNOSIS — E785 Hyperlipidemia, unspecified: Secondary | ICD-10-CM | POA: Diagnosis not present

## 2018-07-18 DIAGNOSIS — Z0000 Encounter for general adult medical examination without abnormal findings: Secondary | ICD-10-CM

## 2018-07-18 DIAGNOSIS — I10 Essential (primary) hypertension: Secondary | ICD-10-CM | POA: Diagnosis not present

## 2018-07-18 DIAGNOSIS — R7989 Other specified abnormal findings of blood chemistry: Secondary | ICD-10-CM | POA: Diagnosis not present

## 2018-07-18 LAB — POCT URINALYSIS DIPSTICK
Bilirubin, UA: NEGATIVE
Blood, UA: NEGATIVE
Glucose, UA: NEGATIVE
Ketones, UA: NEGATIVE
Leukocytes, UA: NEGATIVE
Nitrite, UA: NEGATIVE
Protein, UA: NEGATIVE
Spec Grav, UA: 1.015 (ref 1.010–1.025)
Urobilinogen, UA: 0.2 E.U./dL
pH, UA: 6.5 (ref 5.0–8.0)

## 2018-07-18 NOTE — Progress Notes (Signed)
Date:  07/18/2018   Name:  William [name]   DOB:  [birth year]   MRN:  [id number]   Chief Complaint: Annual Exam William [name] is a 48 y.o. male who presents today for his Complete Annual Exam. He feels well. He reports exercising rarely. He reports he is sleeping well.   Hypertension  This is a chronic problem. The problem is controlled. Pertinent negatives include no chest pain, headaches, palpitations or shortness of breath. Past treatments include diuretics and calcium channel blockers. The current treatment provides significant improvement.   Lab Results  Component Value Date   CREATININE 0.99 07/12/2017   BUN 14 07/12/2017   NA 136 07/12/2017   K 4.5 07/12/2017   CL 97 07/12/2017   CO2 25 07/12/2017   Lab Results  Component Value Date   CHOL 203 (H) 07/12/2017   HDL 39 (L) 07/12/2017   LDLCALC 141 (H) 07/12/2017   TRIG 114 07/12/2017   CHOLHDL 5.2 (H) 07/12/2017    Review of Systems  Constitutional: Negative for appetite change, chills, diaphoresis, fatigue and unexpected weight change.  HENT: Negative for hearing loss, tinnitus, trouble swallowing and voice change.   Eyes: Negative for visual disturbance.  Respiratory: Negative for choking, shortness of breath and wheezing.   Cardiovascular: Negative for chest pain, palpitations and leg swelling.  Gastrointestinal: Negative for abdominal pain, blood in stool, constipation and diarrhea.  Genitourinary: Negative for difficulty urinating, dysuria and frequency.  Musculoskeletal: Negative for arthralgias, back pain and myalgias.  Skin: Negative for color change and rash.  Neurological: Negative for dizziness, syncope and headaches.  Hematological: Negative for adenopathy.  Psychiatric/Behavioral: Negative for dysphoric mood and sleep disturbance.    Patient Active Problem List   Diagnosis Date Noted  . Dyslipidemia (high LDL; low HDL) 08/06/2016  . Plantar fasciitis, bilateral 06/24/2016  . Family history of  premature coronary heart disease 05/28/2015  . Essential hypertension 05/28/2015  . Insomnia, persistent 05/28/2015  . 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 none      Social History   Tobacco Use  . Smoking status: Never Smoker  . Smokeless tobacco: Never Used  Substance Use Topics  . Alcohol use: Yes    Alcohol/week: 1.0 standard drinks    Types: 1 Standard drinks or equivalent per week  . Drug use: No     Medication list has been reviewed and updated.  Current Meds  Medication Sig  . amLODipine (NORVASC) 5 MG tablet Take 1 tablet (5 mg total) by mouth daily.  . hydrochlorothiazide (HYDRODIURIL) 25 MG tablet Take 1 tablet (25 mg total) by mouth daily.    PHQ 2/9 Scores 07/18/2018 07/12/2017 06/24/2016  PHQ - 2 Score 0 0 0    Physical Exam Vitals signs and nursing note reviewed.  Constitutional:      Appearance: Normal appearance. He is well-developed.  HENT:     Head: Normocephalic.     Right Ear: Tympanic membrane, ear canal and external ear normal.     Left Ear: Tympanic membrane, ear canal and external ear normal.     Nose: Nose normal.     Mouth/Throat:     Pharynx: Uvula midline.  Eyes:     Conjunctiva/sclera: Conjunctivae normal.     Pupils: Pupils are equal, round, and reactive to light.  Neck:     Musculoskeletal: Normal range of motion and neck supple.     Thyroid: No  thyromegaly.     Vascular: No carotid bruit.  Cardiovascular:     Rate and Rhythm: Normal rate and regular rhythm.     Heart sounds: Normal heart sounds.  Pulmonary:     Effort: Pulmonary effort is normal.     Breath sounds: Normal breath sounds. No wheezing.  Chest:     Breasts:        Right: No mass.        Left: No mass.  Abdominal:     General: Bowel sounds are normal.     Palpations: Abdomen is soft.     Tenderness: There is no abdominal tenderness.  Musculoskeletal: Normal range of motion.  Lymphadenopathy:      Cervical: No cervical adenopathy.  Skin:    General: Skin is warm and dry.  Neurological:     Mental Status: He is alert and oriented to person, place, and time.     Deep Tendon Reflexes: Reflexes are normal and symmetric.  Psychiatric:        Speech: Speech normal.        Behavior: Behavior normal.        Thought Content: Thought content normal.        Judgment: Judgment normal.    Wt Readings from Last 3 Encounters:  07/18/18 249 lb (112.9 kg)  01/10/18 247 lb (112 kg)  07/12/17 252 lb (114.3 kg)     BP 120/78   Pulse 61   Ht 5\' 7"  (1.702 m)   Wt 249 lb (112.9 kg)   SpO2 95%   BMI 39.00 kg/m   Assessment and Plan: 1. Annual physical exam Normal exam except for weight Encourage regular exercise and diet changes Pt declines flu vaccine - POCT urinalysis dipstick  2. Essential hypertension controlled - CBC with Differential/Platelet - Comprehensive metabolic panel  3. Dyslipidemia (high LDL; low HDL) Will consider statin therapy age 16 due to family hx - Lipid panel   Partially dictated using Editor, commissioning. Any errors are unintentional.  Halina Maidens, MD [facility]  07/18/2018

## 2018-07-18 NOTE — Patient Instructions (Signed)

## 2018-07-19 LAB — CBC WITH DIFFERENTIAL/PLATELET
BASOS: 1 %
Basophils Absolute: 0.1 10*3/uL (ref 0.0–0.2)
EOS (ABSOLUTE): 0.3 10*3/uL (ref 0.0–0.4)
EOS: 4 %
Hematocrit: 48.2 % (ref 37.5–51.0)
Hemoglobin: 16.7 g/dL (ref 13.0–17.7)
Immature Grans (Abs): 0 10*3/uL (ref 0.0–0.1)
Immature Granulocytes: 0 %
LYMPHS ABS: 2.2 10*3/uL (ref 0.7–3.1)
Lymphs: 28 %
MCH: 32.2 pg (ref 26.6–33.0)
MCHC: 34.6 g/dL (ref 31.5–35.7)
MCV: 93 fL (ref 79–97)
Monocytes Absolute: 0.6 10*3/uL (ref 0.1–0.9)
Monocytes: 8 %
NEUTROS ABS: 4.7 10*3/uL (ref 1.4–7.0)
Neutrophils: 59 %
PLATELETS: 213 10*3/uL (ref 150–450)
RBC: 5.19 x10E6/uL (ref 4.14–5.80)
RDW: 12.6 % (ref 11.6–15.4)
WBC: 7.9 10*3/uL (ref 3.4–10.8)

## 2018-07-19 LAB — COMPREHENSIVE METABOLIC PANEL
ALK PHOS: 64 IU/L (ref 39–117)
ALT: 54 IU/L — AB (ref 0–44)
AST: 34 IU/L (ref 0–40)
Albumin/Globulin Ratio: 1.8 (ref 1.2–2.2)
Albumin: 4.6 g/dL (ref 4.0–5.0)
BILIRUBIN TOTAL: 0.5 mg/dL (ref 0.0–1.2)
BUN/Creatinine Ratio: 13 (ref 9–20)
BUN: 13 mg/dL (ref 6–24)
CHLORIDE: 97 mmol/L (ref 96–106)
CO2: 21 mmol/L (ref 20–29)
Calcium: 9.5 mg/dL (ref 8.7–10.2)
Creatinine, Ser: 1.02 mg/dL (ref 0.76–1.27)
GFR calc non Af Amer: 86 mL/min/{1.73_m2} (ref >59)
GFR, EST AFRICAN AMERICAN: 99 mL/min/{1.73_m2} (ref >59)
GLUCOSE: 120 mg/dL — AB (ref 65–99)
Globulin, Total: 2.6 g/dL (ref 1.5–4.5)
POTASSIUM: 4.1 mmol/L (ref 3.5–5.2)
Sodium: 138 mmol/L (ref 134–144)
TOTAL PROTEIN: 7.2 g/dL (ref 6.0–8.5)

## 2018-07-19 LAB — LIPID PANEL
CHOLESTEROL TOTAL: 202 mg/dL — AB (ref 100–199)
Chol/HDL Ratio: 4.8 ratio (ref 0.0–5.0)
HDL: 42 mg/dL (ref >39)
LDL Calculated: 138 mg/dL — ABNORMAL HIGH (ref 0–99)
TRIGLYCERIDES: 111 mg/dL (ref 0–149)
VLDL Cholesterol Cal: 22 mg/dL (ref 5–40)

## 2018-07-24 LAB — SPECIMEN STATUS REPORT

## 2018-07-24 LAB — HGB A1C W/O EAG: Hgb A1c MFr Bld: 6.7 % — ABNORMAL HIGH (ref 4.8–5.6)

## 2018-11-01 ENCOUNTER — Other Ambulatory Visit: Payer: Self-pay

## 2018-11-01 ENCOUNTER — Encounter: Payer: Self-pay | Admitting: Internal Medicine

## 2018-11-01 ENCOUNTER — Ambulatory Visit (INDEPENDENT_AMBULATORY_CARE_PROVIDER_SITE_OTHER): Payer: BLUE CROSS/BLUE SHIELD | Admitting: Internal Medicine

## 2018-11-01 VITALS — BP 128/64 | HR 78 | Ht 67.0 in | Wt 245.0 lb

## 2018-11-01 DIAGNOSIS — R7303 Prediabetes: Secondary | ICD-10-CM | POA: Diagnosis not present

## 2018-11-01 DIAGNOSIS — E118 Type 2 diabetes mellitus with unspecified complications: Secondary | ICD-10-CM | POA: Insufficient documentation

## 2018-11-01 DIAGNOSIS — I10 Essential (primary) hypertension: Secondary | ICD-10-CM | POA: Diagnosis not present

## 2018-11-01 NOTE — Progress Notes (Signed)
Date:  11/01/2018   Name:  William [name]   DOB:  06-Jul-1970   MRN:  [id number]   Chief Complaint: Diabetes (Follow up.) He reports changing his diet, stopping sodas and was going to the gym before Covid-19 pandemic and had lost about 15 lbs.  Diabetes  He presents for his follow-up diabetic visit. Diabetes type: pre-diabetes. His disease course has been stable. Pertinent negatives for hypoglycemia include no dizziness, headaches or tremors. Pertinent negatives for diabetes include no chest pain, no fatigue, no polydipsia and no polyuria. Current diabetic treatment includes diet. He is compliant with treatment most of the time. Weight trend: down 5 lbs. An ACE inhibitor/angiotensin II receptor blocker is not being taken.  Hypertension  This is a chronic problem. The problem is controlled. Pertinent negatives include no chest pain, headaches, palpitations or shortness of breath. Past treatments include calcium channel blockers and diuretics (had severe ED with [facility]). The current treatment provides significant improvement.   Lab Results  Component Value Date   HGBA1C 6.7 (H) 07/18/2018    Review of Systems  Constitutional: Negative for appetite change, fatigue and unexpected weight change.  Eyes: Negative for visual disturbance.  Respiratory: Negative for cough, shortness of breath and wheezing.   Cardiovascular: Negative for chest pain, palpitations and leg swelling.  Gastrointestinal: Negative for abdominal pain, blood in stool, constipation and diarrhea.  Endocrine: Negative for polydipsia and polyuria.  Genitourinary: Negative for dysuria and hematuria.  Musculoskeletal: Negative for arthralgias.  Skin: Negative for color change and rash.  Allergic/Immunologic: Negative for environmental allergies.  Neurological: Negative for dizziness, tremors, numbness and headaches.  Hematological: Negative for adenopathy.  Psychiatric/Behavioral: Negative for dysphoric mood and sleep  disturbance.    Patient Active Problem List   Diagnosis Date Noted  . Dyslipidemia (high LDL; low HDL) 08/06/2016  . Plantar fasciitis, bilateral 06/24/2016  . Family history of premature coronary heart disease 05/28/2015  . Essential hypertension 05/28/2015  . Insomnia, persistent 05/28/2015  . 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 none      Social History   Tobacco Use  . Smoking status: Never Smoker  . Smokeless tobacco: Never Used  Substance Use Topics  . Alcohol use: Yes    Alcohol/week: 1.0 standard drinks    Types: 1 Standard drinks or equivalent per week  . Drug use: No     Medication list has been reviewed and updated.  Current Meds  Medication Sig  . amLODipine (NORVASC) 5 MG tablet Take 1 tablet (5 mg total) by mouth daily.  . hydrochlorothiazide (HYDRODIURIL) 25 MG tablet Take 1 tablet (25 mg total) by mouth daily.    PHQ 2/9 Scores 11/01/2018 07/18/2018 07/12/2017 06/24/2016  PHQ - 2 Score 0 0 0 0    BP Readings from Last 3 Encounters:  11/01/18 128/64  07/18/18 120/78  01/10/18 130/84    Physical Exam Vitals signs and nursing note reviewed.  Constitutional:      General: He is not in acute distress.    Appearance: He is well-developed.  HENT:     Head: Normocephalic and atraumatic.  Eyes:     Pupils: Pupils are equal, round, and reactive to light.  Neck:     Musculoskeletal: Normal range of motion and neck supple.     Vascular: No carotid bruit.  Cardiovascular:     Rate and Rhythm: Normal rate and regular rhythm.     Pulses:  Normal pulses.  Pulmonary:     Effort: Pulmonary effort is normal. No respiratory distress.     Breath sounds: Normal breath sounds. No wheezing or rhonchi.  Musculoskeletal: Normal range of motion.     Right lower leg: No edema.     Left lower leg: No edema.  Skin:    General: Skin is warm and dry.     Findings: No rash.  Neurological:     Mental  Status: He is alert and oriented to person, place, and time.  Psychiatric:        Behavior: Behavior normal.        Thought Content: Thought content normal.     Wt Readings from Last 3 Encounters:  11/01/18 245 lb (111.1 kg)  07/18/18 249 lb (112.9 kg)  01/10/18 247 lb (112 kg)    BP 128/64   Pulse 78   Ht 5\' 7"  (1.702 m)   Wt 245 lb (111.1 kg)   SpO2 97%   BMI 38.37 kg/m   Assessment and Plan: 1. Pre-diabetes Continue efforts at diet and exercise with weight loss - Hemoglobin A1c  2. Essential hypertension controlled   Partially dictated using Editor, commissioning. Any errors are unintentional.  Halina Maidens, MD [facility]  11/01/2018

## 2018-11-02 LAB — HEMOGLOBIN A1C
Est. average glucose Bld gHb Est-mCnc: 134 mg/dL
Hgb A1c MFr Bld: 6.3 % — ABNORMAL HIGH (ref 4.8–5.6)

## 2019-01-24 ENCOUNTER — Other Ambulatory Visit: Payer: Self-pay

## 2019-01-24 ENCOUNTER — Ambulatory Visit (INDEPENDENT_AMBULATORY_CARE_PROVIDER_SITE_OTHER): Payer: BC Managed Care – PPO | Admitting: Internal Medicine

## 2019-01-24 ENCOUNTER — Encounter: Payer: Self-pay | Admitting: Internal Medicine

## 2019-01-24 VITALS — BP 126/86 | HR 87 | Ht 67.0 in | Wt 242.0 lb

## 2019-01-24 DIAGNOSIS — E785 Hyperlipidemia, unspecified: Secondary | ICD-10-CM

## 2019-01-24 DIAGNOSIS — I10 Essential (primary) hypertension: Secondary | ICD-10-CM | POA: Diagnosis not present

## 2019-01-24 DIAGNOSIS — R7303 Prediabetes: Secondary | ICD-10-CM | POA: Diagnosis not present

## 2019-01-24 MED ORDER — AMLODIPINE BESYLATE 5 MG PO TABS: 5.0000 mg | ORAL_TABLET | Freq: Every day | ORAL | 3 refills | Status: DC

## 2019-01-24 MED ORDER — HYDROCHLOROTHIAZIDE 25 MG PO TABS: 25.0000 mg | ORAL_TABLET | Freq: Every day | ORAL | 3 refills | Status: DC

## 2019-01-24 NOTE — Progress Notes (Signed)
Date:  01/24/2019   Name:  William [name]   DOB:  Jun 06, 1971   MRN:  [id number]   Chief Complaint: Hypertension  Hypertension This is a chronic problem. The problem is controlled. Pertinent negatives include no chest pain, headaches, palpitations or shortness of breath. There are no associated agents to hypertension. Risk factors for coronary artery disease include diabetes mellitus, male gender and family history. Past treatments include diuretics and calcium channel blockers. The current treatment provides significant improvement. Compliance problems: BP has been good at the plasma donation center.   Diabetes He presents for his follow-up diabetic visit. Diabetes type: prediabetes. His disease course has been improving. Pertinent negatives for hypoglycemia include no headaches or tremors. Pertinent negatives for diabetes include no chest pain, no fatigue, no polydipsia and no polyuria. Symptoms are stable. Current diabetic treatment includes diet. His weight is stable (has lost a few pounds). There is no compliance with monitoring of blood glucose. An ACE inhibitor/angiotensin II receptor blocker is not being taken.  Hyperlipidemia This is a chronic problem. The problem is uncontrolled. Pertinent negatives include no chest pain or shortness of breath. Current antihyperlipidemic treatment includes diet change (has cut back on red meats and sugary sodas). Risk factors for coronary artery disease include family history, diabetes mellitus, hypertension, male sex and obesity.  He is not interested in cholesterol lowering medications at this time.  Discussed goal for LDL < 130.    Lab Results  Component Value Date   HGBA1C 6.3 (H) 11/01/2018   Lab Results  Component Value Date   CREATININE 1.02 07/18/2018   BUN 13 07/18/2018   NA 138 07/18/2018   K 4.1 07/18/2018   CL 97 07/18/2018   CO2 21 07/18/2018   Lab Results  Component Value Date   CHOL 202 (H) 07/18/2018   HDL 42 07/18/2018   LDLCALC 138 (H) 07/18/2018   TRIG 111 07/18/2018   CHOLHDL 4.8 07/18/2018    Review of Systems  Constitutional: Negative for appetite change, fatigue and unexpected weight change.  Eyes: Negative for visual disturbance.  Respiratory: Negative for cough, shortness of breath and wheezing.   Cardiovascular: Negative for chest pain, palpitations and leg swelling.  Gastrointestinal: Negative for abdominal pain and blood in stool.  Endocrine: Negative for polydipsia and polyuria.  Genitourinary: Negative for dysuria and hematuria.  Skin: Negative for color change and rash.  Neurological: Negative for tremors, numbness and headaches.  Psychiatric/Behavioral: Negative for dysphoric mood.    Patient Active Problem List   Diagnosis Date Noted  . Pre-diabetes 11/01/2018  . Dyslipidemia (high LDL; low HDL) 08/06/2016  . Plantar fasciitis, bilateral 06/24/2016  . Family history of premature coronary heart disease 05/28/2015  . Essential hypertension 05/28/2015  . Insomnia, persistent 05/28/2015  . 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 none      Social History   Tobacco Use  . Smoking status: Never Smoker  . Smokeless tobacco: Never Used  Substance Use Topics  . Alcohol use: Yes    Alcohol/week: 1.0 standard drinks    Types: 1 Standard drinks or equivalent per week  . Drug use: No     Medication list has been reviewed and updated.  Current Meds  Medication Sig  . amLODipine (NORVASC) 5 MG tablet Take 1 tablet (5 mg total) by mouth daily.  . hydrochlorothiazide (HYDRODIURIL) 25 MG tablet Take 1 tablet (25 mg total) by mouth daily.  PHQ 2/9 Scores 01/24/2019 11/01/2018 07/18/2018 07/12/2017  PHQ - 2 Score 0 0 0 0    BP Readings from Last 3 Encounters:  01/24/19 126/86  11/01/18 128/64  07/18/18 120/78    Physical Exam Vitals signs and nursing note reviewed.  Constitutional:      General: He is not in  acute distress.    Appearance: He is well-developed.  HENT:     Head: Normocephalic and atraumatic.  Neck:     Musculoskeletal: Normal range of motion and neck supple.  Cardiovascular:     Rate and Rhythm: Normal rate and regular rhythm.     Pulses: Normal pulses.     Heart sounds: No murmur.  Pulmonary:     Effort: Pulmonary effort is normal. No respiratory distress.  Musculoskeletal: Normal range of motion.        General: Swelling (trace ankle edema) present.  Skin:    General: Skin is warm and dry.     Capillary Refill: Capillary refill takes less than 2 seconds.     Findings: No rash.  Neurological:     General: No focal deficit present.     Mental Status: He is alert and oriented to person, place, and time.  Psychiatric:        Behavior: Behavior normal.        Thought Content: Thought content normal.     Wt Readings from Last 3 Encounters:  01/24/19 242 lb (109.8 kg)  11/01/18 245 lb (111.1 kg)  07/18/18 249 lb (112.9 kg)    BP 126/86   Pulse 87   Ht 5\' 7"  (1.702 m)   Wt 242 lb (109.8 kg)   SpO2 97%   BMI 37.90 kg/m   Assessment and Plan: 1. Essential hypertension BP is controlled, pt is asx with a normal exam besides weight Continue to work on weight loss, exercise and continue current medications - Comprehensive metabolic panel - amLODipine (NORVASC) 5 MG tablet; Take 1 tablet (5 mg total) by mouth daily.  Dispense: 90 tablet; Refill: 3 - hydrochlorothiazide (HYDRODIURIL) 25 MG tablet; Take 1 tablet (25 mg total) by mouth daily.  Dispense: 90 tablet; Refill: 3  2. Pre-diabetes Weight and diet continue to improve Will advise if medication needs to be initiated - Comprehensive metabolic panel - Hemoglobin A1c  3. Dyslipidemia (high LDL; low HDL) Goal LDL < 130 given multiple risk factors  - he will continue to limit fats, continue exercise and consider medications if not improving - Lipid panel   Partially dictated using Dragon software. Any errors  are unintentional.  Halina Maidens, MD [facility]  01/24/2019

## 2019-01-25 LAB — COMPREHENSIVE METABOLIC PANEL
ALT: 50 IU/L — ABNORMAL HIGH (ref 0–44)
AST: 32 IU/L (ref 0–40)
Albumin/Globulin Ratio: 2.2 (ref 1.2–2.2)
Albumin: 4.3 g/dL (ref 4.0–5.0)
Alkaline Phosphatase: 55 IU/L (ref 39–117)
BUN/Creatinine Ratio: 14 (ref 9–20)
BUN: 18 mg/dL (ref 6–24)
Bilirubin Total: 0.4 mg/dL (ref 0.0–1.2)
CO2: 23 mmol/L (ref 20–29)
Calcium: 8.9 mg/dL (ref 8.7–10.2)
Chloride: 98 mmol/L (ref 96–106)
Creatinine, Ser: 1.31 mg/dL — ABNORMAL HIGH (ref 0.76–1.27)
GFR calc Af Amer: 73 mL/min/{1.73_m2} (ref >59)
GFR calc non Af Amer: 63 mL/min/{1.73_m2} (ref >59)
Globulin, Total: 2 g/dL (ref 1.5–4.5)
Glucose: 147 mg/dL — ABNORMAL HIGH (ref 65–99)
Potassium: 3.9 mmol/L (ref 3.5–5.2)
Sodium: 139 mmol/L (ref 134–144)
Total Protein: 6.3 g/dL (ref 6.0–8.5)

## 2019-01-25 LAB — LIPID PANEL
Chol/HDL Ratio: 5.4 ratio — ABNORMAL HIGH (ref 0.0–5.0)
Cholesterol, Total: 185 mg/dL (ref 100–199)
HDL: 34 mg/dL — ABNORMAL LOW (ref >39)
LDL Calculated: 116 mg/dL — ABNORMAL HIGH (ref 0–99)
Triglycerides: 174 mg/dL — ABNORMAL HIGH (ref 0–149)
VLDL Cholesterol Cal: 35 mg/dL (ref 5–40)

## 2019-01-25 LAB — HEMOGLOBIN A1C
Est. average glucose Bld gHb Est-mCnc: 137 mg/dL
Hgb A1c MFr Bld: 6.4 % — ABNORMAL HIGH (ref 4.8–5.6)

## 2019-05-02 DIAGNOSIS — Z03818 Encounter for observation for suspected exposure to other biological agents ruled out: Secondary | ICD-10-CM | POA: Diagnosis not present

## 2019-07-20 ENCOUNTER — Encounter: Payer: BLUE CROSS/BLUE SHIELD | Admitting: Internal Medicine

## 2019-07-23 ENCOUNTER — Other Ambulatory Visit: Payer: Self-pay

## 2019-07-23 ENCOUNTER — Encounter: Payer: Self-pay | Admitting: Internal Medicine

## 2019-07-23 ENCOUNTER — Ambulatory Visit (INDEPENDENT_AMBULATORY_CARE_PROVIDER_SITE_OTHER): Payer: BC Managed Care – PPO | Admitting: Internal Medicine

## 2019-07-23 VITALS — BP 132/82 | HR 70 | Temp 97.6°F | Ht 67.0 in | Wt 247.0 lb

## 2019-07-23 DIAGNOSIS — E785 Hyperlipidemia, unspecified: Secondary | ICD-10-CM | POA: Diagnosis not present

## 2019-07-23 DIAGNOSIS — I10 Essential (primary) hypertension: Secondary | ICD-10-CM

## 2019-07-23 DIAGNOSIS — Z0000 Encounter for general adult medical examination without abnormal findings: Secondary | ICD-10-CM | POA: Diagnosis not present

## 2019-07-23 DIAGNOSIS — Z1211 Encounter for screening for malignant neoplasm of colon: Secondary | ICD-10-CM

## 2019-07-23 DIAGNOSIS — R7303 Prediabetes: Secondary | ICD-10-CM | POA: Diagnosis not present

## 2019-07-23 NOTE — Progress Notes (Signed)
Date:  07/23/2019   Name:  William [name]   DOB:  March 27, 1971   MRN:  [id number]   Chief Complaint: Annual Exam William [name] is a 49 y.o. male who presents today for his Complete Annual Exam. He feels well. He reports exercising at home. He reports he is sleeping fairly well.   Colonoscopy - due Immunization History  Administered Date(s) Administered  . Hepatitis B 11/18/2014  . Tdap 06/28/2013    Hypertension This is a chronic problem. The problem is controlled (normal when he donates plasma). Pertinent negatives include no chest pain, headaches, palpitations or shortness of breath. Past treatments include diuretics and calcium channel blockers. The current treatment provides significant improvement.  Diabetes He presents for his follow-up diabetic visit. Diabetes type: pre-diabetes. His disease course has been stable. Pertinent negatives for hypoglycemia include no dizziness or headaches. Pertinent negatives for diabetes include no chest pain and no fatigue. Current diabetic treatment includes diet.    Lab Results  Component Value Date   CREATININE 1.31 (H) 01/24/2019   BUN 18 01/24/2019   NA 139 01/24/2019   K 3.9 01/24/2019   CL 98 01/24/2019   CO2 23 01/24/2019   Lab Results  Component Value Date   CHOL 185 01/24/2019   HDL 34 (L) 01/24/2019   LDLCALC 116 (H) 01/24/2019   TRIG 174 (H) 01/24/2019   CHOLHDL 5.4 (H) 01/24/2019   Lab Results  Component Value Date   TSH 2.490 06/24/2016   Lab Results  Component Value Date   HGBA1C 6.4 (H) 01/24/2019     Review of Systems  Constitutional: Negative for appetite change, chills, diaphoresis, fatigue and unexpected weight change.  HENT: Negative for hearing loss, tinnitus, trouble swallowing and voice change.   Eyes: Negative for visual disturbance.  Respiratory: Negative for choking, shortness of breath and wheezing.   Cardiovascular: Negative for chest pain, palpitations and leg swelling.  Gastrointestinal:  Negative for abdominal pain, blood in stool, constipation and diarrhea.  Genitourinary: Negative for difficulty urinating, dysuria and frequency.  Musculoskeletal: Positive for arthralgias (left knee pain). Negative for back pain and myalgias.  Skin: Negative for color change and rash.  Neurological: Negative for dizziness, syncope and headaches.  Hematological: Negative for adenopathy.  Psychiatric/Behavioral: Negative for dysphoric mood and sleep disturbance.    Patient Active Problem List   Diagnosis Date Noted  . Pre-diabetes 11/01/2018  . Dyslipidemia (high LDL; low HDL) 08/06/2016  . Plantar fasciitis, bilateral 06/24/2016  . Family history of premature coronary heart disease 05/28/2015  . Essential hypertension 05/28/2015  . Insomnia, persistent 05/28/2015  . 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 none      Social History   Tobacco Use  . Smoking status: Never Smoker  . Smokeless tobacco: Never Used  Substance Use Topics  . Alcohol use: Yes    Alcohol/week: 1.0 standard drinks    Types: 1 Standard drinks or equivalent per week  . Drug use: No     Medication list has been reviewed and updated.  Current Meds  Medication Sig  . amLODipine (NORVASC) 5 MG tablet Take 1 tablet (5 mg total) by mouth daily.  . hydrochlorothiazide (HYDRODIURIL) 25 MG tablet Take 1 tablet (25 mg total) by mouth daily.    PHQ 2/9 Scores 07/23/2019 01/24/2019 11/01/2018 07/18/2018  PHQ - 2 Score 0 0 0 0    BP Readings from Last 3 Encounters:  07/23/19 132/82  01/24/19 126/86  11/01/18 128/64    Physical Exam Vitals and nursing note reviewed.  Constitutional:      Appearance: Normal appearance. He is well-developed.  HENT:     Head: Normocephalic.     Right Ear: Tympanic membrane, ear canal and external ear normal.     Left Ear: Tympanic membrane, ear canal and external ear normal.     Nose: Nose normal.      Mouth/Throat:     Pharynx: Uvula midline.  Eyes:     Conjunctiva/sclera: Conjunctivae normal.     Pupils: Pupils are equal, round, and reactive to light.  Neck:     Thyroid: No thyromegaly.     Vascular: No carotid bruit.  Cardiovascular:     Rate and Rhythm: Normal rate and regular rhythm.     Heart sounds: Normal heart sounds.  Pulmonary:     Effort: Pulmonary effort is normal.     Breath sounds: Normal breath sounds. No wheezing.  Chest:     Breasts:        Right: No mass.        Left: No mass.  Abdominal:     General: Bowel sounds are normal.     Palpations: Abdomen is soft.     Tenderness: There is no abdominal tenderness.  Musculoskeletal:        General: Normal range of motion.     Cervical back: Normal range of motion and neck supple.     Right knee: Normal.     Left knee: Normal.  Lymphadenopathy:     Cervical: No cervical adenopathy.  Skin:    General: Skin is warm and dry.  Neurological:     Mental Status: He is alert and oriented to person, place, and time.     Deep Tendon Reflexes: Reflexes are normal and symmetric.  Psychiatric:        Speech: Speech normal.        Behavior: Behavior normal.        Thought Content: Thought content normal.        Judgment: Judgment normal.     Wt Readings from Last 3 Encounters:  07/23/19 247 lb (112 kg)  01/24/19 242 lb (109.8 kg)  11/01/18 245 lb (111.1 kg)    BP 132/82   Pulse 70   Temp 97.6 F (36.4 C) (Oral)   Ht 5\' 7"  (1.702 m)   Wt 247 lb (112 kg)   SpO2 97%   BMI 38.69 kg/m   Assessment and Plan: 1. Annual physical exam Normal exam except for weight Continue to work on diet and exercise - POCT urinalysis dipstick - cancelled  2. Colon cancer screening - Ambulatory referral to Gastroenterology  3. Essential hypertension Clinically stable exam with well controlled BP on amlodipine and hctz. Tolerating medications without side effects at this time. Pt to continue current regimen and low sodium  diet; benefits of regular exercise as able discussed. - CBC with Differential/Platelet - Comprehensive metabolic panel  4. Pre-diabetes Continue to work on diet, weight loss Will advise if worsening and medication is needed - Hemoglobin A1c  5. Dyslipidemia (high LDL; low HDL) Has not needed medication - will continue to monitor and advise - Lipid panel   Partially dictated using Dragon software. Any errors are unintentional.  Halina Maidens, MD [facility]  07/23/2019

## 2019-07-24 ENCOUNTER — Telehealth: Payer: Self-pay

## 2019-07-24 LAB — CBC WITH DIFFERENTIAL/PLATELET
Basophils Absolute: 0.1 10*3/uL (ref 0.0–0.2)
Basos: 1 %
EOS (ABSOLUTE): 0.4 10*3/uL (ref 0.0–0.4)
Eos: 6 %
Hematocrit: 50.6 % (ref 37.5–51.0)
Hemoglobin: 17.6 g/dL (ref 13.0–17.7)
Immature Grans (Abs): 0 10*3/uL (ref 0.0–0.1)
Immature Granulocytes: 1 %
Lymphocytes Absolute: 1.9 10*3/uL (ref 0.7–3.1)
Lymphs: 28 %
MCH: 31.7 pg (ref 26.6–33.0)
MCHC: 34.8 g/dL (ref 31.5–35.7)
MCV: 91 fL (ref 79–97)
Monocytes Absolute: 0.7 10*3/uL (ref 0.1–0.9)
Monocytes: 10 %
Neutrophils Absolute: 3.7 10*3/uL (ref 1.4–7.0)
Neutrophils: 54 %
Platelets: 239 10*3/uL (ref 150–450)
RBC: 5.56 x10E6/uL (ref 4.14–5.80)
RDW: 12 % (ref 11.6–15.4)
WBC: 6.8 10*3/uL (ref 3.4–10.8)

## 2019-07-24 LAB — COMPREHENSIVE METABOLIC PANEL
ALT: 54 IU/L — ABNORMAL HIGH (ref 0–44)
AST: 30 IU/L (ref 0–40)
Albumin/Globulin Ratio: 2.1 (ref 1.2–2.2)
Albumin: 4.1 g/dL (ref 4.0–5.0)
Alkaline Phosphatase: 66 IU/L (ref 39–117)
BUN/Creatinine Ratio: 14 (ref 9–20)
BUN: 13 mg/dL (ref 6–24)
Bilirubin Total: 0.6 mg/dL (ref 0.0–1.2)
CO2: 22 mmol/L (ref 20–29)
Calcium: 9.2 mg/dL (ref 8.7–10.2)
Chloride: 101 mmol/L (ref 96–106)
Creatinine, Ser: 0.96 mg/dL (ref 0.76–1.27)
GFR calc Af Amer: 106 mL/min/{1.73_m2} (ref >59)
GFR calc non Af Amer: 92 mL/min/{1.73_m2} (ref >59)
Globulin, Total: 2 g/dL (ref 1.5–4.5)
Glucose: 189 mg/dL — ABNORMAL HIGH (ref 65–99)
Potassium: 4.3 mmol/L (ref 3.5–5.2)
Sodium: 138 mmol/L (ref 134–144)
Total Protein: 6.1 g/dL (ref 6.0–8.5)

## 2019-07-24 LAB — HEMOGLOBIN A1C
Est. average glucose Bld gHb Est-mCnc: 169 mg/dL
Hgb A1c MFr Bld: 7.5 % — ABNORMAL HIGH (ref 4.8–5.6)

## 2019-07-24 LAB — LIPID PANEL
Chol/HDL Ratio: 5.8 ratio — ABNORMAL HIGH (ref 0.0–5.0)
Cholesterol, Total: 214 mg/dL — ABNORMAL HIGH (ref 100–199)
HDL: 37 mg/dL — ABNORMAL LOW (ref >39)
LDL Chol Calc (NIH): 133 mg/dL — ABNORMAL HIGH (ref 0–99)
Triglycerides: 248 mg/dL — ABNORMAL HIGH (ref 0–149)
VLDL Cholesterol Cal: 44 mg/dL — ABNORMAL HIGH (ref 5–40)

## 2019-07-24 NOTE — Telephone Encounter (Signed)
LVM returning patients call to schedule colonoscopy.  Asked patient to call office back to schedule.  Thanks,  [place], [place]

## 2019-07-25 ENCOUNTER — Encounter: Payer: Self-pay | Admitting: *Deleted

## 2019-07-26 ENCOUNTER — Other Ambulatory Visit: Payer: Self-pay | Admitting: Internal Medicine

## 2019-07-26 DIAGNOSIS — E785 Hyperlipidemia, unspecified: Secondary | ICD-10-CM

## 2019-07-26 DIAGNOSIS — E118 Type 2 diabetes mellitus with unspecified complications: Secondary | ICD-10-CM

## 2019-07-26 MED ORDER — METFORMIN HCL ER 500 MG PO TB24: 500.0000 mg | ORAL_TABLET | Freq: Every day | ORAL | 1 refills | Status: DC

## 2019-07-26 MED ORDER — ATORVASTATIN CALCIUM 10 MG PO TABS: 10.0000 mg | ORAL_TABLET | Freq: Every day | ORAL | 1 refills | Status: DC

## 2019-07-26 NOTE — Progress Notes (Signed)
Called and spoke with pt. He was upset about cholesterol and diabetes since he has been working on his diet. He does not eat any sugars or drink sodas now.   Patient agrees to try metformin and a new cholesterol medication. Needs sent in to [facility] in [place].  Made a 3 month fastin appt for him in May.

## 2019-07-30 ENCOUNTER — Telehealth: Payer: Self-pay

## 2019-07-30 ENCOUNTER — Other Ambulatory Visit: Payer: Self-pay

## 2019-07-30 DIAGNOSIS — Z1211 Encounter for screening for malignant neoplasm of colon: Secondary | ICD-10-CM

## 2019-07-30 MED ORDER — NA SULFATE-K SULFATE-MG SULF 17.5-3.13-1.6 GM/177ML PO SOLN: 354.0000 mL | Freq: Once | ORAL | 0 refills | Status: AC

## 2019-07-30 NOTE — Telephone Encounter (Signed)
Gastroenterology Pre-Procedure Review    PATIENT REVIEW QUESTIONS: The patient responded to the following health history questions as indicated:    1. Are you having any GI issues? no 2. Do you have a personal history of Polyps? no 3. Do you have a family history of Colon Cancer or Polyps? no 4. Diabetes Mellitus? Yes just started Metformin  5. Joint replacements in the past 12 months?no 6. Major health problems in the past 3 months?no 7. Any artificial heart valves, MVP, or defibrillator?no    MEDICATIONS & ALLERGIES:    Patient reports the following regarding taking any anticoagulation/antiplatelet therapy:   Plavix, Coumadin, Eliquis, Xarelto, Lovenox, Pradaxa, Brilinta, or Effient? no Aspirin? no  Patient confirms/reports the following medications:  Current Outpatient Medications  Medication Sig Dispense Refill  . amLODipine (NORVASC) 5 MG tablet Take 1 tablet (5 mg total) by mouth daily. 90 tablet 3  . atorvastatin (LIPITOR) 10 MG tablet Take 1 tablet (10 mg total) by mouth daily. 90 tablet 1  . hydrochlorothiazide (HYDRODIURIL) 25 MG tablet Take 1 tablet (25 mg total) by mouth daily. 90 tablet 3  . metFORMIN (GLUCOPHAGE-XR) 500 MG 24 hr tablet Take 1 tablet (500 mg total) by mouth daily with breakfast. 90 tablet 1   No current facility-administered medications for this visit.    Patient confirms/reports the following allergies:  No Known Allergies  No orders of the defined types were placed in this encounter.   AUTHORIZATION INFORMATION Primary Insurance: 1D#: Group #:  Secondary Insurance: 1D#: Group #:  SCHEDULE INFORMATION: Date: 08/21/2019 Time: Location: [place]

## 2019-08-15 ENCOUNTER — Encounter: Payer: Self-pay | Admitting: Gastroenterology

## 2019-08-15 ENCOUNTER — Other Ambulatory Visit: Payer: Self-pay

## 2019-08-17 ENCOUNTER — Other Ambulatory Visit
Admission: RE | Admit: 2019-08-17 | Discharge: 2019-08-17 | Disposition: A | Discharge status: Home / Self Care | Payer: BC Managed Care – PPO | Source: Ambulatory Visit | Attending: Gastroenterology | Admitting: Gastroenterology

## 2019-08-17 ENCOUNTER — Other Ambulatory Visit: Payer: Self-pay

## 2019-08-17 DIAGNOSIS — Z20822 Contact with and (suspected) exposure to covid-19: Secondary | ICD-10-CM | POA: Insufficient documentation

## 2019-08-17 DIAGNOSIS — Z01812 Encounter for preprocedural laboratory examination: Secondary | ICD-10-CM | POA: Diagnosis not present

## 2019-08-18 LAB — SARS CORONAVIRUS 2 (TAT 6-24 HRS): SARS Coronavirus 2: NEGATIVE

## 2019-08-20 NOTE — Discharge Instructions (Signed)
General Anesthesia, Adult, Care After This sheet gives you information about how to care for yourself after your procedure. Your health care provider may also give you more specific instructions. If you have problems or questions, contact your health care provider. What can I expect after the procedure? After the procedure, the following side effects are common:  Pain or discomfort at the IV site.  Nausea.  Vomiting.  Sore throat.  Trouble concentrating.  Feeling cold or chills.  Weak or tired.  Sleepiness and fatigue.  Soreness and body aches. These side effects can affect parts of the body that were not involved in surgery. Follow these instructions at home:  For at least 24 hours after the procedure:  Have a responsible adult stay with you. It is important to have someone help care for you until you are awake and alert.  Rest as needed.  Do not: ? Participate in activities in which you could fall or become injured. ? Drive. ? Use heavy machinery. ? Drink alcohol. ? Take sleeping pills or medicines that cause drowsiness. ? Make important decisions or sign legal documents. ? Take care of children on your own. Eating and drinking  Follow any instructions from your health care provider about eating or drinking restrictions.  When you feel hungry, start by eating small amounts of foods that are soft and easy to digest (bland), such as toast. Gradually return to your regular diet.  Drink enough fluid to keep your urine pale yellow.  If you vomit, rehydrate by drinking water, juice, or clear broth. General instructions  If you have sleep apnea, surgery and certain medicines can increase your risk for breathing problems. Follow instructions from your health care provider about wearing your sleep device: ? Anytime you are sleeping, including during daytime naps. ? While taking prescription pain medicines, sleeping medicines, or medicines that make you drowsy.  Return to  your normal activities as told by your health care provider. Ask your health care provider what activities are safe for you.  Take over-the-counter and prescription medicines only as told by your health care provider.  If you smoke, do not smoke without supervision.  Keep all follow-up visits as told by your health care provider. This is important. Contact a health care provider if:  You have nausea or vomiting that does not get better with medicine.  You cannot eat or drink without vomiting.  You have pain that does not get better with medicine.  You are unable to pass urine.  You develop a skin rash.  You have a fever.  You have redness around your IV site that gets worse. Get help right away if:  You have difficulty breathing.  You have chest pain.  You have blood in your urine or stool, or you vomit blood. Summary  After the procedure, it is common to have a sore throat or nausea. It is also common to feel tired.  Have a responsible adult stay with you for the first 24 hours after general anesthesia. It is important to have someone help care for you until you are awake and alert.  When you feel hungry, start by eating small amounts of foods that are soft and easy to digest (bland), such as toast. Gradually return to your regular diet.  Drink enough fluid to keep your urine pale yellow.  Return to your normal activities as told by your health care provider. Ask your health care provider what activities are safe for you. This information is not   intended to replace advice given to you by your health care provider. Make sure you discuss any questions you have with your health care provider. Document Revised: 06/03/2017 Document Reviewed: 01/14/2017 Elsevier Patient Education  2020 Elsevier Inc[employer]  

## 2019-08-21 ENCOUNTER — Encounter
Admission: RE | Disposition: A | Discharge status: Home / Self Care | Payer: Self-pay | Source: Home / Self Care | Attending: Gastroenterology

## 2019-08-21 ENCOUNTER — Encounter: Payer: Self-pay | Admitting: Gastroenterology

## 2019-08-21 ENCOUNTER — Ambulatory Visit: Payer: BC Managed Care – PPO | Admitting: Anesthesiology

## 2019-08-21 ENCOUNTER — Ambulatory Visit
Admission: RE | Admit: 2019-08-21 | Discharge: 2019-08-21 | Disposition: A | Discharge status: Home / Self Care | Payer: BC Managed Care – PPO | Attending: Gastroenterology | Admitting: Gastroenterology

## 2019-08-21 ENCOUNTER — Other Ambulatory Visit: Payer: Self-pay

## 2019-08-21 DIAGNOSIS — D128 Benign neoplasm of rectum: Secondary | ICD-10-CM | POA: Diagnosis not present

## 2019-08-21 DIAGNOSIS — E119 Type 2 diabetes mellitus without complications: Secondary | ICD-10-CM | POA: Diagnosis not present

## 2019-08-21 DIAGNOSIS — K644 Residual hemorrhoidal skin tags: Secondary | ICD-10-CM | POA: Insufficient documentation

## 2019-08-21 DIAGNOSIS — Z79899 Other long term (current) drug therapy: Secondary | ICD-10-CM | POA: Insufficient documentation

## 2019-08-21 DIAGNOSIS — K635 Polyp of colon: Secondary | ICD-10-CM | POA: Diagnosis not present

## 2019-08-21 DIAGNOSIS — Z7984 Long term (current) use of oral hypoglycemic drugs: Secondary | ICD-10-CM | POA: Insufficient documentation

## 2019-08-21 DIAGNOSIS — Z8249 Family history of ischemic heart disease and other diseases of the circulatory system: Secondary | ICD-10-CM | POA: Diagnosis not present

## 2019-08-21 DIAGNOSIS — Z1211 Encounter for screening for malignant neoplasm of colon: Secondary | ICD-10-CM | POA: Diagnosis not present

## 2019-08-21 DIAGNOSIS — K621 Rectal polyp: Secondary | ICD-10-CM | POA: Diagnosis not present

## 2019-08-21 DIAGNOSIS — I10 Essential (primary) hypertension: Secondary | ICD-10-CM | POA: Diagnosis not present

## 2019-08-21 DIAGNOSIS — D126 Benign neoplasm of colon, unspecified: Secondary | ICD-10-CM

## 2019-08-21 HISTORY — DX: Unspecified convulsions: R56.9

## 2019-08-21 HISTORY — PX: POLYPECTOMY: SHX5525

## 2019-08-21 HISTORY — PX: COLONOSCOPY WITH PROPOFOL: SHX5780

## 2019-08-21 LAB — GLUCOSE, CAPILLARY: Glucose-Capillary: 182 mg/dL — ABNORMAL HIGH (ref 70–99)

## 2019-08-21 SURGERY — COLONOSCOPY WITH PROPOFOL
Anesthesia: General | Site: Rectum

## 2019-08-21 MED ORDER — ACETAMINOPHEN 325 MG PO TABS: 325.0000 mg | ORAL_TABLET | Freq: Once | ORAL | Status: DC

## 2019-08-21 MED ORDER — LACTATED RINGERS IV SOLN: 10.0000 mL/h | INTRAVENOUS | Status: DC

## 2019-08-21 MED ORDER — LIDOCAINE HCL (CARDIAC) PF 100 MG/5ML IV SOSY
PREFILLED_SYRINGE | INTRAVENOUS | Status: DC | PRN
  Administered 2019-08-21: 30 mg via INTRAVENOUS

## 2019-08-21 MED ORDER — ACETAMINOPHEN 160 MG/5ML PO SOLN: 325.0000 mg | Freq: Once | ORAL | Status: DC

## 2019-08-21 MED ORDER — PROPOFOL 10 MG/ML IV BOLUS
INTRAVENOUS | Status: DC | PRN
  Administered 2019-08-21: 50 mg via INTRAVENOUS
  Administered 2019-08-21: 150 mg via INTRAVENOUS
  Administered 2019-08-21 (×2): 40 mg via INTRAVENOUS
  Administered 2019-08-21 (×3): 50 mg via INTRAVENOUS
  Administered 2019-08-21 (×2): 40 mg via INTRAVENOUS
  Administered 2019-08-21: 50 mg via INTRAVENOUS
  Administered 2019-08-21: 40 mg via INTRAVENOUS

## 2019-08-21 MED ORDER — STERILE WATER FOR IRRIGATION IR SOLN
Status: DC | PRN
  Administered 2019-08-21: 100 mL

## 2019-08-21 SURGICAL SUPPLY — 7 items
CANISTER SUCT 1200ML W/VALVE (MISCELLANEOUS) ×3 IMPLANT
GOWN CVR UNV OPN BCK APRN NK (MISCELLANEOUS) ×2 IMPLANT
GOWN ISOL THUMB LOOP REG UNIV (MISCELLANEOUS) ×4
KIT ENDO PROCEDURE OLY (KITS) ×3 IMPLANT
SNARE COLD EXACTO (MISCELLANEOUS) ×3 IMPLANT
TRAP ETRAP POLY (MISCELLANEOUS) ×3 IMPLANT
WATER STERILE IRR 250ML POUR (IV SOLUTION) ×3 IMPLANT

## 2019-08-21 NOTE — Anesthesia Postprocedure Evaluation (Signed)
Anesthesia Post Note  Patient: William [name]  Procedure(s) Performed: COLONOSCOPY WITH BIOPSY (N/A Rectum) POLYPECTOMY (N/A Rectum)     Patient location during evaluation: PACU Anesthesia Type: General Level of consciousness: awake and alert and oriented Pain management: satisfactory to patient Vital Signs Assessment: post-procedure vital signs reviewed and stable Respiratory status: spontaneous breathing, nonlabored ventilation and respiratory function stable Cardiovascular status: blood pressure returned to baseline and stable Postop Assessment: Adequate PO intake and No signs of nausea or vomiting Anesthetic complications: no    Raliegh Ip

## 2019-08-21 NOTE — Anesthesia Preprocedure Evaluation (Signed)
Anesthesia Evaluation  Patient identified by MRN, date of birth, ID band Patient awake    Reviewed: Allergy & Precautions, H&P , NPO status , Patient's Chart, lab work & pertinent test results  Airway Mallampati: II  TM Distance: >3 FB Neck ROM: full    Dental no notable dental hx.    Pulmonary    Pulmonary exam normal breath sounds clear to auscultation       Cardiovascular hypertension, On Medications Normal cardiovascular exam Rhythm:regular Rate:Normal     Neuro/Psych    GI/Hepatic   Endo/Other  diabetes, Type 2  Renal/GU      Musculoskeletal   Abdominal   Peds  Hematology   Anesthesia Other Findings   Reproductive/Obstetrics                             Anesthesia Physical Anesthesia Plan  ASA: II  Anesthesia Plan: General   Post-op Pain Management:    Induction: Intravenous  PONV Risk Score and Plan: 2 and Treatment may vary due to age or medical condition, Propofol infusion and TIVA  Airway Management Planned: Natural Airway  Additional Equipment:   Intra-op Plan:   Post-operative Plan:   Informed Consent: I have reviewed the patients History and Physical, chart, labs and discussed the procedure including the risks, benefits and alternatives for the proposed anesthesia with the patient or authorized representative who has indicated his/her understanding and acceptance.     Dental Advisory Given  Plan Discussed with: CRNA  Anesthesia Plan Comments:         Anesthesia Quick Evaluation

## 2019-08-21 NOTE — Op Note (Signed)
[facility] Patient Name: William [name] Procedure Date: 08/21/2019 10:01 AM MRN: [id number] Account #: [id number] Date of Birth: 06-27-1970 Admit Type: Outpatient Age: 49 Room: [facility] OR ROOM 01 Gender: Male Note Status: Finalized Procedure:             Colonoscopy Indications:           Screening for colorectal malignant neoplasm, This is                         the patient's first colonoscopy Providers:             Lin Landsman MD, MD Referring MD:          Halina Maidens, MD (Referring MD) Medicines:             Monitored Anesthesia Care Complications:         No immediate complications. Estimated blood loss: None. Procedure:             Pre-Anesthesia Assessment:                        - Prior to the procedure, a History and Physical was                         performed, and patient medications and allergies were                         reviewed. The patient is competent. The risks and                         benefits of the procedure and the sedation options and                         risks were discussed with the patient. All questions                         were answered and informed consent was obtained.                         Patient identification and proposed procedure were                         verified by the physician, the nurse, the                         anesthesiologist, the anesthetist and the technician                         in the pre-procedure area in the procedure room in the                         endoscopy suite. Mental Status Examination: alert and                         oriented. Airway Examination: normal oropharyngeal                         airway and neck mobility. Respiratory Examination:  clear to auscultation. CV Examination: normal.                         Prophylactic Antibiotics: The patient does not require                         prophylactic antibiotics. Prior  Anticoagulants: The                         patient has taken no previous anticoagulant or                         antiplatelet agents. ASA Grade Assessment: II - A                         patient with mild systemic disease. After reviewing                         the risks and benefits, the patient was deemed in                         satisfactory condition to undergo the procedure. The                         anesthesia plan was to use monitored anesthesia care                         (MAC). Immediately prior to administration of                         medications, the patient was re-assessed for adequacy                         to receive sedatives. The heart rate, respiratory                         rate, oxygen saturations, blood pressure, adequacy of                         pulmonary ventilation, and response to care were                         monitored throughout the procedure. The physical                         status of the patient was re-assessed after the                         procedure.                        After obtaining informed consent, the colonoscope was                         passed under direct vision. Throughout the procedure,                         the patient's blood pressure, pulse, and oxygen  saturations were monitored continuously. The was                         introduced through the anus and advanced to the the                         cecum, identified by appendiceal orifice and ileocecal                         valve. The colonoscopy was performed with difficulty                         due to the patient's body habitus. Successful                         completion of the procedure was aided by applying                         abdominal pressure. The patient tolerated the                         procedure well. The quality of the bowel preparation                         was evaluated using the BBPS [facility] Bowel Preparation                          Scale) with scores of: Right Colon = 3, Transverse                         Colon = 3 and Left Colon = 3 (entire mucosa seen well                         with no residual staining, small fragments of stool or                         opaque liquid). The total BBPS score equals 9. Findings:      Skin tags were found on perianal exam.      A 5 mm polyp was found in the rectum. The polyp was sessile. The polyp       was removed with a cold snare. Resection and retrieval were complete.      The retroflexed view of the distal rectum and anal verge was normal and       showed no anal or rectal abnormalities. Impression:            - Perianal skin tags found on perianal exam.                        - One 5 mm polyp in the rectum, removed with a cold                         snare. Resected and retrieved.                        - The distal rectum and anal verge are normal on  retroflexion view. Recommendation:        - Discharge patient to home (with escort).                        - Resume previous diet today.                        - Continue present medications.                        - Await pathology results.                        - Repeat colonoscopy in 7-10 years for surveillance                         based on pathology results. Procedure Code(s):     --- Professional ---                        [phone], Colonoscopy, flexible; with removal of                         tumor(s), polyp(s), or other lesion(s) by snare                         technique Diagnosis Code(s):     --- Professional ---                        Z12.11, Encounter for screening for malignant neoplasm                         of colon                        K62.1, Rectal polyp                        K64.4, Residual hemorrhoidal skin tags CPT copyright 2019 American Medical Association. All rights reserved. The codes documented in this report are preliminary and upon coder  review may  be revised to meet current compliance requirements. Dr. Ulyess Mort Lin Landsman MD, MD 08/21/2019 10:35:13 AM This report has been signed electronically. Number of Addenda: 0 Note Initiated On: 08/21/2019 10:01 AM Scope Withdrawal Time: 0 hours 14 minutes 42 seconds  Total Procedure Duration: 0 hours 19 minutes 25 seconds  Estimated Blood Loss:  Estimated blood loss: none.      [facility]

## 2019-08-21 NOTE — Anesthesia Procedure Notes (Signed)
Date/Time: 08/21/2019 10:08 AM Performed by: Cameron Ali, CRNA Pre-anesthesia Checklist: Patient identified, Emergency Drugs available, Suction available, Timeout performed and Patient being monitored Patient Re-evaluated:Patient Re-evaluated prior to induction Oxygen Delivery Method: Nasal cannula Placement Confirmation: positive ETCO2

## 2019-08-21 NOTE — H&P (Signed)
William Darby, MD [address]  [place]  [place], [place] [address]  Main: [phone]  Fax: [phone] Pager: [phone]  Primary Care Physician:  Glean Hess, MD Primary Gastroenterologist:  Dr. Cephas [name]  Pre-Procedure History & Physical: HPI:  William [name] is a 49 y.o. male is here for an colonoscopy.   Past Medical History:  Diagnosis Date  . Hypertension   . Seizure [facility])    childhood - none since age 67     Past Surgical History:  Procedure Laterality Date  . NO PAST SURGERIES    . none      Prior to Admission medications   Medication Sig Start Date End Date Taking? Authorizing Provider  amLODipine (NORVASC) 5 MG tablet Take 1 tablet (5 mg total) by mouth daily. 01/24/19  Yes Glean Hess, MD  atorvastatin (LIPITOR) 10 MG tablet Take 1 tablet (10 mg total) by mouth daily. 07/26/19  Yes Glean Hess, MD  hydrochlorothiazide (HYDRODIURIL) 25 MG tablet Take 1 tablet (25 mg total) by mouth daily. 01/24/19  Yes Glean Hess, MD  metFORMIN (GLUCOPHAGE-XR) 500 MG 24 hr tablet Take 1 tablet (500 mg total) by mouth daily with breakfast. 07/26/19  Yes Glean Hess, MD    Allergies as of 07/30/2019  . (No Known Allergies)    Family History  Problem Relation Age of Onset  . CAD Father 33  . Hypertension Father   . Lung cancer Mother     Social History   Socioeconomic History  . Marital status: Married    Spouse name: Not on file  . Number of children: Not on file  . Years of education: Not on file  . Highest education level: Not on file  Occupational History  . Not on file  Tobacco Use  . Smoking status: Never Smoker  . Smokeless tobacco: Never Used  Substance and Sexual Activity  . Alcohol use: Yes    Alcohol/week: 1.0 standard drinks    Types: 1 Standard drinks or equivalent per week    Comment: couple times/year  . Drug use: No  . Sexual activity: Yes  Other Topics Concern  . Not on file  Social History  Narrative  . Not on file   Social Determinants of Health   Financial Resource Strain:   . Difficulty of Paying Living Expenses: Not on file  Food Insecurity:   . Worried About Charity fundraiser in the Last Year: Not on file  . Ran Out of Food in the Last Year: Not on file  Transportation Needs:   . Lack of Transportation (Medical): Not on file  . Lack of Transportation (Non-Medical): Not on file  Physical Activity:   . Days of Exercise per Week: Not on file  . Minutes of Exercise per Session: Not on file  Stress:   . Feeling of Stress : Not on file  Social Connections:   . Frequency of Communication with Friends and Family: Not on file  . Frequency of Social Gatherings with Friends and Family: Not on file  . Attends Religious Services: Not on file  . Active Member of Clubs or Organizations: Not on file  . Attends Archivist Meetings: Not on file  . Marital Status: Not on file  Intimate Partner Violence:   . Fear of Current or Ex-Partner: Not on file  . Emotionally Abused: Not on file  . Physically Abused: Not on file  . Sexually Abused: Not on  file    Review of Systems: See HPI, otherwise negative ROS  Physical Exam: BP (!) 144/101   Pulse 72   Temp 97.6 F (36.4 C) (Temporal)   Ht 5\' 7"  (1.702 m)   Wt 109.3 kg   SpO2 97%   BMI 37.75 kg/m  [name]:   Alert,  pleasant and cooperative in NAD Head:  Normocephalic and atraumatic. Neck:  Supple; no masses or thyromegaly. Lungs:  Clear throughout to auscultation.    Heart:  Regular rate and rhythm. Abdomen:  Soft, nontender and nondistended. Normal bowel sounds, without guarding, and without rebound.   Neurologic:  Alert and  oriented x4;  grossly normal neurologically.  Impression/Plan: William [name] is here for an colonoscopy to be performed for colon cancer screening  Risks, benefits, limitations, and alternatives regarding  colonoscopy have been reviewed with the patient.  Questions have been  answered.  All parties agreeable.   Sherri Sear, MD  08/21/2019, 9:18 AM

## 2019-08-21 NOTE — Transfer of Care (Signed)
Immediate Anesthesia Transfer of Care Note  Patient: William [name]  Procedure(s) Performed: COLONOSCOPY WITH BIOPSY (N/A Rectum) POLYPECTOMY (N/A Rectum)  Patient Location: PACU  Anesthesia Type: General  Level of Consciousness: awake, alert  and patient cooperative  Airway and Oxygen Therapy: Patient Spontanous Breathing and Patient connected to supplemental oxygen  Post-op Assessment: Post-op Vital signs reviewed, Patient's Cardiovascular Status Stable, Respiratory Function Stable, Patent Airway and No signs of Nausea or vomiting  Post-op Vital Signs: Reviewed and stable  Complications: No apparent anesthesia complications

## 2019-08-22 ENCOUNTER — Encounter: Payer: Self-pay | Admitting: Gastroenterology

## 2019-08-22 ENCOUNTER — Encounter: Payer: Self-pay | Admitting: *Deleted

## 2019-08-22 LAB — SURGICAL PATHOLOGY

## 2019-08-23 ENCOUNTER — Encounter: Payer: Self-pay | Admitting: Internal Medicine

## 2019-10-16 ENCOUNTER — Ambulatory Visit (INDEPENDENT_AMBULATORY_CARE_PROVIDER_SITE_OTHER): Payer: BC Managed Care – PPO | Admitting: Internal Medicine

## 2019-10-16 ENCOUNTER — Other Ambulatory Visit: Payer: Self-pay

## 2019-10-16 ENCOUNTER — Encounter: Payer: Self-pay | Admitting: Internal Medicine

## 2019-10-16 VITALS — BP 136/88 | HR 65 | Temp 97.9°F | Ht 67.0 in | Wt 244.0 lb

## 2019-10-16 DIAGNOSIS — E785 Hyperlipidemia, unspecified: Secondary | ICD-10-CM

## 2019-10-16 DIAGNOSIS — I10 Essential (primary) hypertension: Secondary | ICD-10-CM | POA: Diagnosis not present

## 2019-10-16 DIAGNOSIS — E118 Type 2 diabetes mellitus with unspecified complications: Secondary | ICD-10-CM

## 2019-10-16 MED ORDER — BLOOD GLUCOSE MONITOR KIT: PACK | 0 refills | Status: DC

## 2019-10-16 NOTE — Progress Notes (Signed)
Date:  10/16/2019   Name:  William [name]   DOB:  [birth year]   MRN:  [id number]   Chief Complaint: Diabetes (Follow up with labs. Foot exam, and MICRO. ) and Hyperlipidemia (Lipid recheck.)  Diabetes He presents for his follow-up diabetic visit. He has type 2 diabetes mellitus. The initial diagnosis of diabetes was made 6 months ago. Pertinent negatives for hypoglycemia include no headaches or tremors. Pertinent negatives for diabetes include no chest pain, no fatigue, no polydipsia and no polyuria. Current diabetic treatment includes oral agent (monotherapy) (metformin started last visit). An ACE inhibitor/angiotensin II receptor blocker is not being taken.  Hyperlipidemia This is a chronic problem. Pertinent negatives include no chest pain or shortness of breath. Current antihyperlipidemic treatment includes statins (lipitor started last visit). There are no compliance problems.   Hypertension This is a chronic problem. The problem is controlled. Pertinent negatives include no chest pain, headaches, palpitations or shortness of breath. Past treatments include calcium channel blockers and diuretics.    Lab Results  Component Value Date   CREATININE 0.96 07/23/2019   BUN 13 07/23/2019   NA 138 07/23/2019   K 4.3 07/23/2019   CL 101 07/23/2019   CO2 22 07/23/2019   Lab Results  Component Value Date   CHOL 214 (H) 07/23/2019   HDL 37 (L) 07/23/2019   LDLCALC 133 (H) 07/23/2019   TRIG 248 (H) 07/23/2019   CHOLHDL 5.8 (H) 07/23/2019   Lab Results  Component Value Date   TSH 2.490 06/24/2016   Lab Results  Component Value Date   HGBA1C 7.5 (H) 07/23/2019   Lab Results  Component Value Date   WBC 6.8 07/23/2019   HGB 17.6 07/23/2019   HCT 50.6 07/23/2019   MCV 91 07/23/2019   PLT 239 07/23/2019   Lab Results  Component Value Date   ALT 54 (H) 07/23/2019   AST 30 07/23/2019   ALKPHOS 66 07/23/2019   BILITOT 0.6 07/23/2019     Review of Systems  Constitutional:  Negative for appetite change, fatigue and unexpected weight change.  Eyes: Negative for visual disturbance.  Respiratory: Negative for cough, shortness of breath and wheezing.   Cardiovascular: Negative for chest pain, palpitations and leg swelling.  Gastrointestinal: Negative for abdominal pain and blood in stool.  Endocrine: Negative for polydipsia and polyuria.  Genitourinary: Negative for dysuria and hematuria.  Skin: Negative for color change and rash.  Neurological: Negative for tremors, numbness and headaches.  Psychiatric/Behavioral: Negative for dysphoric mood.    Patient Active Problem List   Diagnosis Date Noted  . Tubular adenoma of colon   . Type II diabetes mellitus with complication ([place]) 16/38/4665  . Dyslipidemia (high LDL; low HDL) 08/06/2016  . Plantar fasciitis, bilateral 06/24/2016  . Family history of premature coronary heart disease 05/28/2015  . Essential hypertension 05/28/2015  . Insomnia, persistent 05/28/2015  . 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 COLONOSCOPY WITH PROPOFOL N/A 08/21/2019   Procedure: COLONOSCOPY WITH BIOPSY;  Surgeon: Lin Landsman, MD;  Location: [facility];  Service: Endoscopy;  Laterality: N/A;  Requests late as possible  . NO PAST SURGERIES    . none    . POLYPECTOMY N/A 08/21/2019   Procedure: POLYPECTOMY;  Surgeon: Lin Landsman, MD;  Location: [facility];  Service: Endoscopy;  Laterality: N/A;    Social History   Tobacco Use  . Smoking status: Never Smoker  .  Smokeless tobacco: Never Used  Substance Use Topics  . Alcohol use: Yes    Alcohol/week: 1.0 standard drinks    Types: 1 Standard drinks or equivalent per week    Comment: couple times/year  . Drug use: No     Medication list has been reviewed and updated.  Current Meds  Medication Sig  . amLODipine (NORVASC) 5 MG tablet Take 1 tablet (5 mg total) by mouth daily.  Marland Kitchen  atorvastatin (LIPITOR) 10 MG tablet Take 1 tablet (10 mg total) by mouth daily.  . hydrochlorothiazide (HYDRODIURIL) 25 MG tablet Take 1 tablet (25 mg total) by mouth daily.  . metFORMIN (GLUCOPHAGE-XR) 500 MG 24 hr tablet Take 1 tablet (500 mg total) by mouth daily with breakfast.    PHQ 2/9 Scores 10/16/2019 07/23/2019 01/24/2019 11/01/2018  PHQ - 2 Score 0 0 0 0  PHQ- 9 Score 0 - - -    BP Readings from Last 3 Encounters:  10/16/19 136/88  08/21/19 (!) 130/93  07/23/19 132/82    Physical Exam Vitals and nursing note reviewed.  Constitutional:      General: He is not in acute distress.    Appearance: He is well-developed.  HENT:     Head: Normocephalic and atraumatic.  Cardiovascular:     Rate and Rhythm: Normal rate and regular rhythm.     Pulses: Normal pulses.     Heart sounds: No murmur.  Pulmonary:     Effort: Pulmonary effort is normal. No respiratory distress.     Breath sounds: No wheezing or rhonchi.  Musculoskeletal:     Cervical back: Normal range of motion.     Right lower leg: No edema.     Left lower leg: No edema.  Lymphadenopathy:     Cervical: No cervical adenopathy.  Skin:    General: Skin is warm and dry.     Capillary Refill: Capillary refill takes less than 2 seconds.     Findings: No rash.  Neurological:     General: No focal deficit present.     Mental Status: He is alert and oriented to person, place, and time.  Psychiatric:        Behavior: Behavior normal.        Thought Content: Thought content normal.     Wt Readings from Last 3 Encounters:  10/16/19 244 lb (110.7 kg)  08/21/19 241 lb (109.3 kg)  07/23/19 247 lb (112 kg)    BP 136/88   Pulse 65   Temp 97.9 F (36.6 C) (Temporal)   Ht _0  (1.702 m)   Wt 244 lb (110.7 kg)   SpO2 96%   BMI 38.22 kg/m   Assessment and Plan: 1. Type II diabetes mellitus with complication (HCC) Clinically stable by exam and report without s/s of hypoglycemia. DM complicated by HTN,  lipids. Tolerating medications - metformin -  well without side effects or other concerns. Begin checking BS 1-2 times per day. Pt declines PPV-23 - Hemoglobin A1c - Comprehensive metabolic panel - Microalbumin / creatinine urine ratio - blood glucose meter kit and supplies KIT; Dispense based on patient and insurance preference. Use up to four times daily as directed. (FOR ICD-9 250.00, 250.01).  Dispense: 1 each; Refill: 0  2. Essential hypertension Clinically stable exam with well controlled BP on amlodipine and hctz. Tolerating medications without side effects at this time. Pt to continue current regimen and low sodium diet; benefits of regular exercise as able discussed.  3. Dyslipidemia (high LDL;  low HDL) Now on high intensity statin without side effects Will check fasting labs - Lipid panel   Partially dictated using Editor, commissioning. Any errors are unintentional.  Halina Maidens, MD [facility]  10/16/2019

## 2019-10-16 NOTE — Patient Instructions (Addendum)
Diabetics need:  Annual Eye exam for retinal changes  Daily foot inspection for blisters, redness, etc  To be on certain types of blood pressure medications to protect the kidneys (you are not already on one of these) or have the urine checked for protein annually  Baby aspirin 81 mg daily   Pneumonia vaccine once - repeated at age 49

## 2019-10-17 ENCOUNTER — Other Ambulatory Visit: Payer: Self-pay | Admitting: Internal Medicine

## 2019-10-17 DIAGNOSIS — E118 Type 2 diabetes mellitus with unspecified complications: Secondary | ICD-10-CM | POA: Diagnosis not present

## 2019-10-17 LAB — COMPREHENSIVE METABOLIC PANEL
ALT: 65 IU/L — ABNORMAL HIGH (ref 0–44)
AST: 50 IU/L — ABNORMAL HIGH (ref 0–40)
Albumin/Globulin Ratio: 1.9 (ref 1.2–2.2)
Albumin: 4.7 g/dL (ref 4.0–5.0)
Alkaline Phosphatase: 76 IU/L (ref 39–117)
BUN/Creatinine Ratio: 15 (ref 9–20)
BUN: 14 mg/dL (ref 6–24)
Bilirubin Total: 0.7 mg/dL (ref 0.0–1.2)
CO2: 19 mmol/L — ABNORMAL LOW (ref 20–29)
Calcium: 9.6 mg/dL (ref 8.7–10.2)
Chloride: 98 mmol/L (ref 96–106)
Creatinine, Ser: 0.96 mg/dL (ref 0.76–1.27)
GFR calc Af Amer: 106 mL/min/{1.73_m2} (ref >59)
GFR calc non Af Amer: 92 mL/min/{1.73_m2} (ref >59)
Globulin, Total: 2.5 g/dL (ref 1.5–4.5)
Glucose: 213 mg/dL — ABNORMAL HIGH (ref 65–99)
Potassium: 4.4 mmol/L (ref 3.5–5.2)
Sodium: 134 mmol/L (ref 134–144)
Total Protein: 7.2 g/dL (ref 6.0–8.5)

## 2019-10-17 LAB — LIPID PANEL
Chol/HDL Ratio: 4.1 ratio (ref 0.0–5.0)
Cholesterol, Total: 157 mg/dL (ref 100–199)
HDL: 38 mg/dL — ABNORMAL LOW (ref >39)
LDL Chol Calc (NIH): 96 mg/dL (ref 0–99)
Triglycerides: 128 mg/dL (ref 0–149)
VLDL Cholesterol Cal: 23 mg/dL (ref 5–40)

## 2019-10-17 LAB — HEMOGLOBIN A1C
Est. average glucose Bld gHb Est-mCnc: 209 mg/dL
Hgb A1c MFr Bld: 8.9 % — ABNORMAL HIGH (ref 4.8–5.6)

## 2019-10-18 ENCOUNTER — Other Ambulatory Visit: Payer: Self-pay | Admitting: Internal Medicine

## 2019-10-18 DIAGNOSIS — E118 Type 2 diabetes mellitus with unspecified complications: Secondary | ICD-10-CM

## 2019-10-18 LAB — MICROALBUMIN / CREATININE URINE RATIO
Creatinine, Urine: 169.6 mg/dL
Microalb/Creat Ratio: 29 mg/g{creat} (ref 0–29)
Microalbumin, Urine: 49.4 ug/mL

## 2019-10-18 MED ORDER — METFORMIN HCL ER 500 MG PO TB24: 1000.0000 mg | ORAL_TABLET | Freq: Every day | ORAL | 1 refills | Status: DC

## 2019-10-25 ENCOUNTER — Ambulatory Visit: Payer: Self-pay | Admitting: Internal Medicine

## 2019-12-06 ENCOUNTER — Telehealth: Payer: Self-pay | Admitting: Internal Medicine

## 2019-12-06 NOTE — Telephone Encounter (Signed)
Copied from [place] [id number]. Topic: Appointment Scheduling - Scheduling Inquiry for Clinic >> Dec 06, 2019  7:54 AM Scherrie Gerlach wrote: Reason for CRM: pt needs hospital fup within 3-4 days.  Pt states he was blowing up a tire yesterday, and the sidewall blew out. He had an abdominal wound, and went to hospital. His employer's workers comp suggested he be seen in a few days by his pcp in case there are any changes. Pt was released and is home. Please advise.

## 2019-12-06 NOTE — Telephone Encounter (Signed)
Called pt scheduled a follow up for next week. Pt is aware if we see him that we do not take workers comp.  KP

## 2019-12-13 ENCOUNTER — Ambulatory Visit (INDEPENDENT_AMBULATORY_CARE_PROVIDER_SITE_OTHER): Payer: Self-pay | Admitting: Internal Medicine

## 2019-12-13 ENCOUNTER — Other Ambulatory Visit: Payer: Self-pay

## 2019-12-13 ENCOUNTER — Encounter: Payer: Self-pay | Admitting: Internal Medicine

## 2019-12-13 VITALS — BP 112/76 | HR 73 | Ht 67.0 in | Wt 232.0 lb

## 2019-12-13 DIAGNOSIS — S301XXA Contusion of abdominal wall, initial encounter: Secondary | ICD-10-CM

## 2019-12-13 NOTE — Progress Notes (Signed)
Date:  12/13/2019   Name:  William [name]   DOB:  1970-08-10   MRN:  [id number]   Chief Complaint: Abdominal Pain (Follow up from abdominal wall bruising- injured at work. Pt aware we do no do workers comp. ) Injury occured on 12/05/19 at work. The side wall of a tire blew out and struck him on the left abdomen and left thigh. Seen in the ER.  Targeted US was done and was normal.  He was monitored for several hours then released. Since then he has returned to work. He feels well with no pain or bruising.  He was told by his employer that he needed a follow up with me. He is working on his diet to improve his DM.  He has cut out a lot of carbs and has lost 9 lbs.   HPI  Lab Results  Component Value Date   CREATININE 0.96 10/16/2019   BUN 14 10/16/2019   NA 134 10/16/2019   K 4.4 10/16/2019   CL 98 10/16/2019   CO2 19 (L) 10/16/2019   Lab Results  Component Value Date   CHOL 157 10/16/2019   HDL 38 (L) 10/16/2019   LDLCALC 96 10/16/2019   TRIG 128 10/16/2019   CHOLHDL 4.1 10/16/2019   Lab Results  Component Value Date   TSH 2.490 06/24/2016   Lab Results  Component Value Date   HGBA1C 8.9 (H) 10/16/2019   Lab Results  Component Value Date   WBC 6.8 07/23/2019   HGB 17.6 07/23/2019   HCT 50.6 07/23/2019   MCV 91 07/23/2019   PLT 239 07/23/2019   Lab Results  Component Value Date   ALT 65 (H) 10/16/2019   AST 50 (H) 10/16/2019   ALKPHOS 76 10/16/2019   BILITOT 0.7 10/16/2019     Review of Systems  Constitutional: Negative for chills and fatigue.  Respiratory: Negative for cough, chest tightness and shortness of breath.   Cardiovascular: Negative for chest pain and palpitations.  Gastrointestinal: Negative for abdominal pain, constipation and diarrhea.  Genitourinary: Negative for dysuria and hematuria.  Neurological: Negative for dizziness, light-headedness and headaches.    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    No Known Allergies  Past Surgical History:  Procedure Laterality Date   COLONOSCOPY WITH PROPOFOL N/A 08/21/2019   Procedure: COLONOSCOPY WITH BIOPSY;  Surgeon: Lin Landsman, MD;  Location: [facility];  Service: Endoscopy;  Laterality: N/A;  Requests late as possible   NO PAST SURGERIES     none     POLYPECTOMY N/A 08/21/2019   Procedure: POLYPECTOMY;  Surgeon: Lin Landsman, MD;  Location: [facility];  Service: Endoscopy;  Laterality: N/A;    Social History   Tobacco Use   Smoking status: Never Smoker   Smokeless tobacco: Never Used  Vaping Use   Vaping Use: Never used  Substance Use Topics   Alcohol use: Yes    Alcohol/week: 1.0 standard drink    Types: 1 Standard drinks or equivalent per week    Comment: couple times/year   Drug use: No     Medication list has been  reviewed and updated.  Current Meds  Medication Sig   amLODipine (NORVASC) 5 MG tablet Take 1 tablet (5 mg total) by mouth daily.   atorvastatin (LIPITOR) 10 MG tablet Take 1 tablet (10 mg total) by mouth daily.   blood glucose meter kit and supplies KIT Dispense based on patient and insurance preference. Use up to four times daily as directed. (FOR ICD-9 250.00, 250.01).   hydrochlorothiazide (HYDRODIURIL) 25 MG tablet Take 1 tablet (25 mg total) by mouth daily.   metFORMIN (GLUCOPHAGE-XR) 500 MG 24 hr tablet Take 2 tablets (1,000 mg total) by mouth daily with breakfast.    PHQ 2/9 Scores 12/13/2019 10/16/2019 07/23/2019 01/24/2019  PHQ - 2 Score 0 0 0 0  PHQ- 9 Score 0 0 - -    GAD 7 : Generalized Anxiety Score 12/13/2019  Nervous, Anxious, on Edge 0   Control/stop worrying 0  Worry too much - different things 0  Trouble relaxing 0  Restless 0  Easily annoyed or irritable 0  Afraid - awful might happen 0  Total GAD 7 Score 0  Anxiety Difficulty Not difficult at all    BP Readings from Last 3 Encounters:  12/13/19 112/76  10/16/19 136/88  08/21/19 (!) 130/93    Physical Exam Vitals and nursing note reviewed.  Constitutional:      General: He is not in acute distress.    Appearance: He is well-developed.  HENT:     Head: Normocephalic and atraumatic.  Cardiovascular:     Rate and Rhythm: Normal rate and regular rhythm.  Pulmonary:     Effort: Pulmonary effort is normal. No respiratory distress.     Breath sounds: Normal breath sounds.  Abdominal:     General: Abdomen is protuberant. Bowel sounds are normal.     Palpations: Abdomen is soft. There is no hepatomegaly or splenomegaly.     Tenderness: There is no abdominal tenderness.    Musculoskeletal:        General: Normal range of motion.  Skin:    General: Skin is warm and dry.     Findings: No rash.  Neurological:     Mental Status: He is alert and oriented to person, place, and time.  Psychiatric:        Behavior: Behavior normal.        Thought Content: Thought content normal.     Wt Readings from Last 3 Encounters:  12/13/19 232 lb (105.2 kg)  10/16/19 244 lb (110.7 kg)  08/21/19 241 lb (109.3 kg)    BP 112/76 (BP Location: Right Arm, Patient Position: Sitting, Cuff Size: Normal)    Pulse 73    Ht '5\' 7"'  (1.702 m)    Wt 232 lb (105.2 kg)    SpO2 97%    BMI 36.34 kg/m   Assessment and Plan: 1. Contusion of abdominal wall, initial encounter Essentially resolved with no complications He can continue usual activities without restriction.   Partially dictated using Editor, commissioning. Any errors are unintentional.  Halina Maidens, MD [facility]  12/13/2019

## 2019-12-19 LAB — HM DIABETES EYE EXAM

## 2020-02-20 ENCOUNTER — Other Ambulatory Visit: Payer: Self-pay

## 2020-02-20 ENCOUNTER — Encounter: Payer: Self-pay | Admitting: Internal Medicine

## 2020-02-20 ENCOUNTER — Ambulatory Visit (INDEPENDENT_AMBULATORY_CARE_PROVIDER_SITE_OTHER): Payer: BC Managed Care – PPO | Admitting: Internal Medicine

## 2020-02-20 VITALS — BP 124/78 | HR 67 | Ht 67.0 in | Wt 234.0 lb

## 2020-02-20 DIAGNOSIS — E1169 Type 2 diabetes mellitus with other specified complication: Secondary | ICD-10-CM

## 2020-02-20 DIAGNOSIS — I10 Essential (primary) hypertension: Secondary | ICD-10-CM | POA: Diagnosis not present

## 2020-02-20 DIAGNOSIS — Z1159 Encounter for screening for other viral diseases: Secondary | ICD-10-CM | POA: Diagnosis not present

## 2020-02-20 DIAGNOSIS — E118 Type 2 diabetes mellitus with unspecified complications: Secondary | ICD-10-CM

## 2020-02-20 DIAGNOSIS — E785 Hyperlipidemia, unspecified: Secondary | ICD-10-CM

## 2020-02-20 MED ORDER — METFORMIN HCL ER 500 MG PO TB24: 1000.0000 mg | ORAL_TABLET | Freq: Every day | ORAL | 3 refills | Status: DC

## 2020-02-20 MED ORDER — HYDROCHLOROTHIAZIDE 25 MG PO TABS: 25.0000 mg | ORAL_TABLET | Freq: Every day | ORAL | 3 refills | Status: DC

## 2020-02-20 MED ORDER — AMLODIPINE BESYLATE 5 MG PO TABS: 5.0000 mg | ORAL_TABLET | Freq: Every day | ORAL | 3 refills | Status: DC

## 2020-02-20 MED ORDER — ATORVASTATIN CALCIUM 10 MG PO TABS: 10.0000 mg | ORAL_TABLET | Freq: Every day | ORAL | 3 refills | Status: DC

## 2020-02-20 NOTE — Progress Notes (Signed)
° ° °Date:  02/20/2020  ° °Name:  William [name]   DOB:  01/09/1971   MRN:  [id number] ° ° °Chief Complaint: Diabetes (4 month follow up. ) and Hypertension ° °Diabetes °He presents for his follow-up diabetic visit. He has type 2 diabetes mellitus. His disease course has been stable. Pertinent negatives for hypoglycemia include no headaches or tremors. Pertinent negatives for diabetes include no chest pain, no fatigue, no polydipsia and no polyuria. Current diabetic treatment includes oral agent (monotherapy) (metformin increased to 2 last visit). He is compliant with treatment all of the time. He monitors blood glucose at home 1-2 x per day. His breakfast blood glucose is taken between 6-7 am. His breakfast blood glucose range is generally 130-140 mg/dl. His bedtime blood glucose is taken between 10-11 pm. His bedtime blood glucose range is generally 110-130 mg/dl. An ACE inhibitor/angiotensin II receptor blocker is not being taken.  °Hypertension °This is a chronic problem. The problem is controlled. Pertinent negatives include no chest pain, headaches, palpitations or shortness of breath. Past treatments include calcium channel blockers and diuretics.  ° ° °Lab Results  °Component Value Date  ° CREATININE 0.96 10/16/2019  ° BUN 14 10/16/2019  ° NA 134 10/16/2019  ° K 4.4 10/16/2019  ° CL 98 10/16/2019  ° CO2 19 (L) 10/16/2019  ° °Lab Results  °Component Value Date  ° CHOL 157 10/16/2019  ° HDL 38 (L) 10/16/2019  ° LDLCALC 96 10/16/2019  ° TRIG 128 10/16/2019  ° CHOLHDL 4.1 10/16/2019  ° °Lab Results  °Component Value Date  ° TSH 2.490 06/24/2016  ° °Lab Results  °Component Value Date  ° HGBA1C 8.9 (H) 10/16/2019  ° °Lab Results  °Component Value Date  ° WBC 6.8 07/23/2019  ° HGB 17.6 07/23/2019  ° HCT 50.6 07/23/2019  ° MCV 91 07/23/2019  ° PLT 239 07/23/2019  ° °Lab Results  °Component Value Date  ° ALT 65 (H) 10/16/2019  ° AST 50 (H) 10/16/2019  ° ALKPHOS 76 10/16/2019  ° BILITOT 0.7 10/16/2019  ° ° ° °Review of  Systems  °Constitutional: Negative for appetite change, fatigue and unexpected weight change.  °Eyes: Negative for visual disturbance.  °Respiratory: Negative for cough, shortness of breath and wheezing.   °Cardiovascular: Negative for chest pain, palpitations and leg swelling.  °Gastrointestinal: Negative for abdominal pain and blood in stool.  °Endocrine: Negative for polydipsia and polyuria.  °Genitourinary: Negative for dysuria and hematuria.  °Skin: Negative for color change and rash.  °Neurological: Negative for tremors, numbness and headaches.  °Psychiatric/Behavioral: Negative for dysphoric mood.  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  ° ° °No Known Allergies ° °Past Surgical History:  °Procedure Laterality Date  °• COLONOSCOPY WITH PROPOFOL N/A 08/21/2019  ° Procedure: COLONOSCOPY WITH BIOPSY;  Surgeon: Vanga, Rohini Reddy, MD;  Location: [facility];  Service: Endoscopy;  Laterality: N/A;  Requests late as possible  °• NO PAST SURGERIES    °• none    °• POLYPECTOMY N/A 08/21/2019  ° Procedure: POLYPECTOMY;  Surgeon: Vanga, Rohini Reddy, MD;  Location: [facility];  Service: Endoscopy;  Laterality: N/A;  ° ° °  Social History   Tobacco Use   Smoking status: Never Smoker   Smokeless tobacco: Never Used  Vaping Use   Vaping Use: Never used  Substance Use Topics   Alcohol use: Yes    Alcohol/week: 1.0 standard drink    Types: 1 Standard drinks or equivalent per week    Comment: couple times/year   Drug use: No     Medication list has been reviewed and updated.  Current Meds  Medication Sig   amLODipine  (NORVASC) 5 MG tablet Take 1 tablet (5 mg total) by mouth daily.   atorvastatin (LIPITOR) 10 MG tablet Take 1 tablet (10 mg total) by mouth daily.   blood glucose meter kit and supplies KIT Dispense based on patient and insurance preference. Use up to four times daily as directed. (FOR ICD-9 250.00, 250.01).   hydrochlorothiazide (HYDRODIURIL) 25 MG tablet Take 1 tablet (25 mg total) by mouth daily.   metFORMIN (GLUCOPHAGE-XR) 500 MG 24 hr tablet Take 2 tablets (1,000 mg total) by mouth daily with breakfast.    PHQ 2/9 Scores 12/13/2019 10/16/2019 07/23/2019 01/24/2019  PHQ - 2 Score 0 0 0 0  PHQ- 9 Score 0 0 - -    GAD 7 : Generalized Anxiety Score 12/13/2019  Nervous, Anxious, on Edge 0  Control/stop worrying 0  Worry too much - different things 0  Trouble relaxing 0  Restless 0  Easily annoyed or irritable 0  Afraid - awful might happen 0  Total GAD 7 Score 0  Anxiety Difficulty Not difficult at all    BP Readings from Last 3 Encounters:  02/20/20 124/78  12/13/19 112/76  10/16/19 136/88    Physical Exam Vitals and nursing note reviewed.  Constitutional:      General: He is not in acute distress.    Appearance: He is well-developed.  HENT:     Head: Normocephalic and atraumatic.  Cardiovascular:     Rate and Rhythm: Normal rate and regular rhythm.     Pulses: Normal pulses.     Heart sounds: No murmur heard.   Pulmonary:     Effort: Pulmonary effort is normal. No respiratory distress.  Musculoskeletal:     Right lower leg: No edema.     Left lower leg: No edema.  Skin:    General: Skin is warm and dry.     Capillary Refill: Capillary refill takes less than 2 seconds.     Findings: No rash.  Neurological:     General: No focal deficit present.     Mental Status: He is alert and oriented to person, place, and time.  Psychiatric:        Behavior: Behavior normal.        Thought Content: Thought content normal.     Wt Readings from Last 3 Encounters:  02/20/20  234 lb (106.1 kg)  12/13/19 232 lb (105.2 kg)  10/16/19 244 lb (110.7 kg)    BP 124/78    Pulse 67    Ht 5' 7" (1.702 m)    Wt 234 lb (106.1 kg)    SpO2 97%    BMI 36.65 kg/m   Assessment and Plan: 1. Type II diabetes mellitus with complication (HCC) Clinically stable by exam and report without s/s of hypoglycemia. DM complicated by HTN. Tolerating medications well without side effects or other concerns. - Basic metabolic panel - Hemoglobin A1c - metFORMIN (GLUCOPHAGE-XR) 500 MG 24 hr tablet; Take 2 tablets (1,000 mg total) by mouth daily with  breakfast.  Dispense: 180 tablet; Refill: 3 ° °2. Essential hypertension °Clinically stable exam with well controlled BP. °Tolerating medications without side effects at this time. °Pt to continue current regimen and low sodium diet; benefits of regular exercise as able discussed. °- amLODipine (NORVASC) 5 MG tablet; Take 1 tablet (5 mg total) by mouth daily.  Dispense: 90 tablet; Refill: 3 °- hydrochlorothiazide (HYDRODIURIL) 25 MG tablet; Take 1 tablet (25 mg total) by mouth daily.  Dispense: 90 tablet; Refill: 3 ° °3. Need for hepatitis C screening test °- Hepatitis C antibody ° °4. Hyperlipidemia associated with type 2 diabetes mellitus (HCC) °- atorvastatin (LIPITOR) 10 MG tablet; Take 1 tablet (10 mg total) by mouth daily.  Dispense: 90 tablet; Refill: 3 ° ° °Partially dictated using Dragon software. Any errors are unintentional. ° °Laura Berglund, MD °[facility] °[facility] ° °02/20/2020 ° ° ° ° °

## 2020-02-21 ENCOUNTER — Other Ambulatory Visit: Payer: Self-pay

## 2020-02-21 ENCOUNTER — Encounter: Payer: Self-pay | Admitting: Emergency Medicine

## 2020-02-21 ENCOUNTER — Ambulatory Visit
Admission: EM | Admit: 2020-02-21 | Discharge: 2020-02-21 | Disposition: A | Discharge status: Home / Self Care | Payer: BC Managed Care – PPO | Attending: Physician Assistant | Admitting: Physician Assistant

## 2020-02-21 DIAGNOSIS — J069 Acute upper respiratory infection, unspecified: Secondary | ICD-10-CM | POA: Diagnosis not present

## 2020-02-21 DIAGNOSIS — Z20822 Contact with and (suspected) exposure to covid-19: Secondary | ICD-10-CM | POA: Diagnosis not present

## 2020-02-21 LAB — BASIC METABOLIC PANEL
BUN/Creatinine Ratio: 17 (ref 9–20)
BUN: 17 mg/dL (ref 6–24)
CO2: 26 mmol/L (ref 20–29)
Calcium: 9.3 mg/dL (ref 8.7–10.2)
Chloride: 98 mmol/L (ref 96–106)
Creatinine, Ser: 0.98 mg/dL (ref 0.76–1.27)
GFR calc Af Amer: 103 mL/min/{1.73_m2} (ref >59)
GFR calc non Af Amer: 89 mL/min/{1.73_m2} (ref >59)
Glucose: 144 mg/dL — ABNORMAL HIGH (ref 65–99)
Potassium: 4 mmol/L (ref 3.5–5.2)
Sodium: 139 mmol/L (ref 134–144)

## 2020-02-21 LAB — HEMOGLOBIN A1C
Est. average glucose Bld gHb Est-mCnc: 154 mg/dL
Hgb A1c MFr Bld: 7 % — ABNORMAL HIGH (ref 4.8–5.6)

## 2020-02-21 LAB — SARS CORONAVIRUS 2 (TAT 6-24 HRS): SARS Coronavirus 2: NEGATIVE

## 2020-02-21 LAB — GROUP A STREP BY PCR: Group A Strep by PCR: NOT DETECTED

## 2020-02-21 LAB — HEPATITIS C ANTIBODY: Hep C Virus Ab: <0.1 s/co ratio (ref 0.0–0.9)

## 2020-02-21 NOTE — ED Provider Notes (Signed)
[facility]    CSN: [id number] Arrival date & time: 02/21/20  1059      History   Chief Complaint Chief Complaint  Patient presents with  . Sore Throat  . Nasal Congestion    HPI William [name] is a 49 y.o. male who presents with onset of rhinitis and ST since yesterday Denies fever, chills, GI symptoms or body aches. Took nyquil yesterday. Denies fatigue.  Has not had the Covid injection    Past Medical History:  Diagnosis Date  . Hypertension   . Seizure [facility])    childhood - none since age 29     Patient Active Problem List   Diagnosis Date Noted  . Tubular adenoma of colon   . Type II diabetes mellitus with complication ([place]) 76/72/0947  . Hyperlipidemia associated with type 2 diabetes mellitus ([place]) 08/06/2016  . Plantar fasciitis, bilateral 06/24/2016  . Family history of premature coronary heart disease 05/28/2015  . Essential hypertension 05/28/2015  . Insomnia, persistent 05/28/2015  . 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 COLONOSCOPY WITH PROPOFOL N/A 08/21/2019   Procedure: COLONOSCOPY WITH BIOPSY;  Surgeon: Lin Landsman, MD;  Location: [place];  Service: Endoscopy;  Laterality: N/A;  Requests late as possible  . NO PAST SURGERIES    . none    . POLYPECTOMY N/A 08/21/2019   Procedure: POLYPECTOMY;  Surgeon: Lin Landsman, MD;  Location: [place];  Service: Endoscopy;  Laterality: N/A;       Home Medications    Prior to Admission medications   Medication Sig Start Date End Date Taking? Authorizing Provider  amLODipine (NORVASC) 5 MG tablet Take 1 tablet (5 mg total) by mouth daily. 02/20/20  Yes Glean Hess, MD  atorvastatin (LIPITOR) 10 MG tablet Take 1 tablet (10 mg total) by mouth daily. 02/20/20  Yes Glean Hess, MD  blood glucose meter kit and supplies KIT Dispense based on patient and insurance preference. Use up to four times daily as directed.  (FOR ICD-9 250.00, 250.01). 10/16/19  Yes Glean Hess, MD  hydrochlorothiazide (HYDRODIURIL) 25 MG tablet Take 1 tablet (25 mg total) by mouth daily. 02/20/20  Yes Glean Hess, MD  metFORMIN (GLUCOPHAGE-XR) 500 MG 24 hr tablet Take 2 tablets (1,000 mg total) by mouth daily with breakfast. 02/20/20  Yes Glean Hess, MD    Family History Family History  Problem Relation Age of Onset  . CAD Father 47  . Hypertension Father   . Lung cancer Mother     Social History Social History   Tobacco Use  . Smoking status: Never Smoker  . Smokeless tobacco: Never Used  Vaping Use  . Vaping Use: Never used  Substance Use Topics  . Alcohol use: Yes    Alcohol/week: 1.0 standard drink    Types: 1 Standard drinks or equivalent per week    Comment: couple times/year  . Drug use: No     Allergies   Patient has no known allergies.   Review of Systems Review of Systems  Constitutional: Negative for appetite change, chills, diaphoresis, fatigue and fever.  HENT: Negative for ear discharge and ear pain.   Respiratory: Positive for cough. Negative for shortness of breath.        Has very mild cough  Musculoskeletal: Negative for myalgias.  Skin: Negative for rash.     Physical Exam Triage Vital Signs ED Triage Vitals  Enc Vitals  Group     BP 02/21/20 1141 (!) 149/109     Pulse Rate 02/21/20 1141 80     Resp 02/21/20 1141 18     Temp 02/21/20 1141 99 F (37.2 C)     Temp Source 02/21/20 1141 Oral     SpO2 02/21/20 1141 97 %     Weight 02/21/20 1138 233 lb 14.5 oz (106.1 kg)     Height 02/21/20 1138 '5\' 7"'  (1.702 m)     Head Circumference --      Peak Flow --      Pain Score 02/21/20 1137 0     Pain Loc --      Pain Edu? --      Excl. in [place]? --    No data found.  Updated Vital Signs BP (!) 149/109 (BP Location: Left Arm)   Pulse 80   Temp 99 F (37.2 C) (Oral)   Resp 18   Ht '5\' 7"'  (1.702 m)   Wt 233 lb 14.5 oz (106.1 kg)   SpO2 97%   BMI 36.64 kg/m    Visual Acuity Right Eye Distance:   Left Eye Distance:   Bilateral Distance:    Right Eye Near:   Left Eye Near:    Bilateral Near:     Physical Exam Physical Exam Vitals signs and nursing note reviewed.  Constitutional:      General: he is not in acute distress.    Appearance: Normal appearance. he is not ill-appearing, toxic-appearing or diaphoretic.  HENT:     Head: Normocephalic.     Right Ear: Tympanic membrane, ear canal and external ear normal.     Left Ear: Tympanic membrane, ear canal and external ear normal.     Nose: Nose normal.     Mouth/Throat: clear    Mouth: Mucous membranes are moist.  Eyes:     General: No scleral icterus.       Right eye: No discharge.        Left eye: No discharge.     Conjunctiva/sclera: Conjunctivae normal.  Neck:     Musculoskeletal: Neck supple. No neck rigidity.  Cardiovascular:     Rate and Rhythm: Normal rate and regular rhythm.     Heart sounds: No murmur.  Pulmonary:     Effort: Pulmonary effort is normal.     Breath sounds: Normal breath sounds.    Musculoskeletal: Normal range of motion.  Lymphadenopathy:     Cervical: No cervical adenopathy.  Skin:    General: Skin is warm and dry.     Coloration: Skin is not jaundiced.     Findings: No rash.  Neurological:     Mental Status: he is alert and oriented to person, place, and time.     Gait: Gait normal.  Psychiatric:        Mood and Affect: Mood normal.        Behavior: Behavior normal.        Thought Content: Thought content normal.        Judgment: Judgment normal.     UC Treatments / Results  Labs (all labs ordered are listed, but only abnormal results are displayed) Labs Reviewed  SARS CORONAVIRUS 2 (TAT 6-24 HRS)  GROUP A STREP BY PCR  Strep is negative  EKG   Radiology No results found.  Procedures Procedures (including critical care time)  Medications Ordered in UC Medications - No data to display  Initial Impression / Assessment and  Plan /  UC Course  I have reviewed the triage vital signs and the nursing notes. Covid test pending.  See instructions.  Final Clinical Impressions(s) / UC Diagnoses   Final diagnoses:  None   Discharge Instructions   None    ED Prescriptions    None     PDMP not reviewed this encounter.   Shelby Mattocks, PA-C 02/21/20 2312

## 2020-02-21 NOTE — ED Triage Notes (Signed)
Pt c/o runny nose and sore throat. Started yesterday. Denies fever, body aches or chills. He has not had covid vaccines.

## 2020-02-21 NOTE — Discharge Instructions (Addendum)
If your Covid test ends up positive you may take the following supplements to help your immune system be stronger to fight this viral infection Take Quarcetin 500 mg three times a day x 7 days with Zinc 50 mg ones a day x 7 days. The quarcetin is an antiviral and anti-inflammatory supplement which helps open the zinc channels in the cell to absorb Zinc. Zinc helps decrease the virus load in your body. Take Melatonin 6-10 mg at bed time which also helps support your immune system.  Also make sure to take Vit D 5,000 IU per day with a fatty meal and Vit C 1000 mg a day until you are completely better. Stay on Vitamin D 2,000  and C the rest of the season.  Don't lay around, keep active and walk as much as you are able to to prevent worsening of your symptoms.  Follow up with your family Dr next week.  If you get short of breath and you are able to check  your oxygen with a pulse oxygen meter, if it gets to 92% or less, you need to go to the hospital to be admitted. If you dont have one, come back here and we will assess you.   

## 2020-03-06 ENCOUNTER — Other Ambulatory Visit: Payer: Self-pay

## 2020-03-06 ENCOUNTER — Encounter: Payer: Self-pay | Admitting: Internal Medicine

## 2020-03-06 ENCOUNTER — Ambulatory Visit (INDEPENDENT_AMBULATORY_CARE_PROVIDER_SITE_OTHER): Payer: BC Managed Care – PPO | Admitting: Internal Medicine

## 2020-03-06 VITALS — BP 124/80 | HR 72 | Temp 97.6°F | Ht 67.0 in | Wt 231.0 lb

## 2020-03-06 DIAGNOSIS — L989 Disorder of the skin and subcutaneous tissue, unspecified: Secondary | ICD-10-CM

## 2020-03-06 NOTE — Progress Notes (Signed)
Date:  03/06/2020   Name:  William [name]   DOB:  09-14-1970   MRN:  [id number]   Chief Complaint: Blister (Right Big Toe - been there for months.Not healing. Painful - throbbing and burning. Hard spot on the bottem of left foot. )  HPI  Lab Results  Component Value Date   CREATININE 0.98 02/20/2020   BUN 17 02/20/2020   NA 139 02/20/2020   K 4.0 02/20/2020   CL 98 02/20/2020   CO2 26 02/20/2020   Lab Results  Component Value Date   CHOL 157 10/16/2019   HDL 38 (L) 10/16/2019   LDLCALC 96 10/16/2019   TRIG 128 10/16/2019   CHOLHDL 4.1 10/16/2019   Lab Results  Component Value Date   TSH 2.490 06/24/2016   Lab Results  Component Value Date   HGBA1C 7.0 (H) 02/20/2020   Lab Results  Component Value Date   WBC 6.8 07/23/2019   HGB 17.6 07/23/2019   HCT 50.6 07/23/2019   MCV 91 07/23/2019   PLT 239 07/23/2019   Lab Results  Component Value Date   ALT 65 (H) 10/16/2019   AST 50 (H) 10/16/2019   ALKPHOS 76 10/16/2019   BILITOT 0.7 10/16/2019     Review of Systems  Skin: Positive for wound (painful to touch).    Patient Active Problem List   Diagnosis Date Noted  . Tubular adenoma of colon   . Type II diabetes mellitus with complication ([place]) 87/86/7672  . Hyperlipidemia associated with type 2 diabetes mellitus ([place]) 08/06/2016  . Plantar fasciitis, bilateral 06/24/2016  . Family history of premature coronary heart disease 05/28/2015  . Essential hypertension 05/28/2015  . Insomnia, persistent 05/28/2015  . 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 COLONOSCOPY WITH PROPOFOL N/A 08/21/2019   Procedure: COLONOSCOPY WITH BIOPSY;  Surgeon: Lin Landsman, MD;  Location: [place];  Service: Endoscopy;  Laterality: N/A;  Requests late as possible  . NO PAST SURGERIES    . none    . POLYPECTOMY N/A 08/21/2019   Procedure: POLYPECTOMY;  Surgeon: Lin Landsman, MD;  Location:  [place];  Service: Endoscopy;  Laterality: N/A;    Social History   Tobacco Use  . Smoking status: Never Smoker  . Smokeless tobacco: Never Used  Vaping Use  . Vaping Use: Never used  Substance Use Topics  . Alcohol use: Yes    Alcohol/week: 1.0 standard drink    Types: 1 Standard drinks or equivalent per week    Comment: couple times/year  . Drug use: No     Medication list has been reviewed and updated.  Current Meds  Medication Sig  . amLODipine (NORVASC) 5 MG tablet Take 1 tablet (5 mg total) by mouth daily.  Marland Kitchen atorvastatin (LIPITOR) 10 MG tablet Take 1 tablet (10 mg total) by mouth daily.  . blood glucose meter kit and supplies KIT Dispense based on patient and insurance preference. Use up to four times daily as directed. (FOR ICD-9 250.00, 250.01).  . hydrochlorothiazide (HYDRODIURIL) 25 MG tablet Take 1 tablet (25 mg total) by mouth daily.  . metFORMIN (GLUCOPHAGE-XR) 500 MG 24 hr tablet Take 2 tablets (1,000 mg total) by mouth daily with breakfast.    PHQ 2/9 Scores 12/13/2019 10/16/2019 07/23/2019 01/24/2019  PHQ - 2 Score 0 0 0 0  PHQ- 9 Score 0 0 - -    GAD 7 :  Generalized Anxiety Score 12/13/2019  Nervous, Anxious, on Edge 0  Control/stop worrying 0  Worry too much - different things 0  Trouble relaxing 0  Restless 0  Easily annoyed or irritable 0  Afraid - awful might happen 0  Total GAD 7 Score 0  Anxiety Difficulty Not difficult at all    BP Readings from Last 3 Encounters:  03/06/20 124/80  02/21/20 (!) 149/109  02/20/20 124/78    Physical Exam Vitals and nursing note reviewed.  Constitutional:      General: He is not in acute distress.    Appearance: He is well-developed.  HENT:     Head: Normocephalic and atraumatic.  Pulmonary:     Effort: Pulmonary effort is normal. No respiratory distress.  Musculoskeletal:        General: Normal range of motion.       Feet:  Skin:    General: Skin is warm and dry.     Findings: No rash.    Neurological:     Mental Status: He is alert and oriented to person, place, and time.  Psychiatric:        Behavior: Behavior normal.        Thought Content: Thought content normal.     Wt Readings from Last 3 Encounters:  03/06/20 231 lb (104.8 kg)  02/21/20 233 lb 14.5 oz (106.1 kg)  02/20/20 234 lb (106.1 kg)    BP 124/80   Pulse 72   Temp 97.6 F (36.4 C) (Oral)   Ht '5\' 7"'  (1.702 m)   Wt 231 lb (104.8 kg)   SpO2 98%   BMI 36.18 kg/m   Assessment and Plan: 1. Lesion of skin of foot Suspect wart but with pain and bleeding No ulceration or sign of infection - Ambulatory referral to Podiatry   Partially dictated using Editor, commissioning. Any errors are unintentional.  Halina Maidens, MD [facility]  03/06/2020

## 2020-03-18 DIAGNOSIS — D2371 Other benign neoplasm of skin of right lower limb, including hip: Secondary | ICD-10-CM | POA: Diagnosis not present

## 2020-03-18 DIAGNOSIS — D2372 Other benign neoplasm of skin of left lower limb, including hip: Secondary | ICD-10-CM | POA: Diagnosis not present

## 2020-03-18 DIAGNOSIS — B070 Plantar wart: Secondary | ICD-10-CM | POA: Diagnosis not present

## 2020-03-18 DIAGNOSIS — E119 Type 2 diabetes mellitus without complications: Secondary | ICD-10-CM | POA: Diagnosis not present

## 2020-03-25 ENCOUNTER — Encounter: Payer: Self-pay | Admitting: Internal Medicine

## 2020-06-11 ENCOUNTER — Encounter: Payer: Self-pay | Admitting: Internal Medicine

## 2020-06-11 ENCOUNTER — Other Ambulatory Visit: Payer: Self-pay

## 2020-06-11 ENCOUNTER — Ambulatory Visit (INDEPENDENT_AMBULATORY_CARE_PROVIDER_SITE_OTHER): Payer: BC Managed Care – PPO | Admitting: Internal Medicine

## 2020-06-11 VITALS — BP 128/74 | HR 71 | Ht 67.0 in | Wt 237.0 lb

## 2020-06-11 DIAGNOSIS — I10 Essential (primary) hypertension: Secondary | ICD-10-CM | POA: Diagnosis not present

## 2020-06-11 DIAGNOSIS — E1169 Type 2 diabetes mellitus with other specified complication: Secondary | ICD-10-CM | POA: Diagnosis not present

## 2020-06-11 DIAGNOSIS — E118 Type 2 diabetes mellitus with unspecified complications: Secondary | ICD-10-CM

## 2020-06-11 DIAGNOSIS — E785 Hyperlipidemia, unspecified: Secondary | ICD-10-CM

## 2020-06-11 NOTE — Progress Notes (Signed)
Date:  06/11/2020   Name:  William [name]   DOB:  15-Jul-1970   MRN:  [id number]   Chief Complaint: Diabetes and Hypertension  Diabetes He presents for his follow-up diabetic visit. He has type 2 diabetes mellitus. His disease course has been stable. Pertinent negatives for hypoglycemia include no headaches or tremors. Pertinent negatives for diabetes include no chest pain, no fatigue, no polydipsia and no polyuria. Current diabetic treatment includes oral agent (monotherapy). He is compliant with treatment all of the time. An ACE inhibitor/angiotensin II receptor blocker is not being taken.  Hypertension This is a chronic problem. The problem is controlled. Pertinent negatives include no chest pain, headaches, palpitations or shortness of breath. Past treatments include diuretics and calcium channel blockers.  Hyperlipidemia This is a chronic problem. The problem is controlled. Pertinent negatives include no chest pain or shortness of breath. Current antihyperlipidemic treatment includes statins. The current treatment provides significant improvement of lipids.    Lab Results  Component Value Date   CREATININE 0.98 02/20/2020   BUN 17 02/20/2020   NA 139 02/20/2020   K 4.0 02/20/2020   CL 98 02/20/2020   CO2 26 02/20/2020   Lab Results  Component Value Date   CHOL 157 10/16/2019   HDL 38 (L) 10/16/2019   LDLCALC 96 10/16/2019   TRIG 128 10/16/2019   CHOLHDL 4.1 10/16/2019   Lab Results  Component Value Date   TSH 2.490 06/24/2016   Lab Results  Component Value Date   HGBA1C 7.0 (H) 02/20/2020   Lab Results  Component Value Date   WBC 6.8 07/23/2019   HGB 17.6 07/23/2019   HCT 50.6 07/23/2019   MCV 91 07/23/2019   PLT 239 07/23/2019   Lab Results  Component Value Date   ALT 65 (H) 10/16/2019   AST 50 (H) 10/16/2019   ALKPHOS 76 10/16/2019   BILITOT 0.7 10/16/2019     Review of Systems  Constitutional: Negative for appetite change, fatigue and unexpected  weight change.  Eyes: Negative for visual disturbance.  Respiratory: Negative for cough, shortness of breath and wheezing.   Cardiovascular: Negative for chest pain, palpitations and leg swelling.  Gastrointestinal: Negative for abdominal pain and blood in stool.  Endocrine: Negative for polydipsia and polyuria.  Genitourinary: Negative for dysuria and hematuria.  Skin: Negative for color change and rash.  Neurological: Negative for tremors, numbness and headaches.  Psychiatric/Behavioral: Negative for dysphoric mood.    Patient Active Problem List   Diagnosis Date Noted  . Tubular adenoma of colon   . Type II diabetes mellitus with complication ([place]) 29/93/7169  . Hyperlipidemia associated with type 2 diabetes mellitus ([place]) 08/06/2016  . Plantar fasciitis, bilateral 06/24/2016  . Family history of premature coronary heart disease 05/28/2015  . Essential hypertension 05/28/2015  . Insomnia, persistent 05/28/2015  . 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 COLONOSCOPY WITH PROPOFOL N/A 08/21/2019   Procedure: COLONOSCOPY WITH BIOPSY;  Surgeon: Lin Landsman, MD;  Location: [facility];  Service: Endoscopy;  Laterality: N/A;  Requests late as possible  . NO PAST SURGERIES    . none    . POLYPECTOMY N/A 08/21/2019   Procedure: POLYPECTOMY;  Surgeon: Lin Landsman, MD;  Location: [facility];  Service: Endoscopy;  Laterality: N/A;    Social History   Tobacco Use  . Smoking status: Never Smoker  . Smokeless tobacco: Never Used  Vaping  Use  . Vaping Use: Never used  Substance Use Topics  . Alcohol use: Yes    Alcohol/week: 1.0 standard drink    Types: 1 Standard drinks or equivalent per week    Comment: couple times/year  . Drug use: No     Medication list has been reviewed and updated.  Current Meds  Medication Sig  . amLODipine (NORVASC) 5 MG tablet Take 1 tablet (5 mg total) by  mouth daily.  Marland Kitchen aspirin 81 MG EC tablet Take by mouth.  Marland Kitchen atorvastatin (LIPITOR) 10 MG tablet Take 1 tablet (10 mg total) by mouth daily.  . blood glucose meter kit and supplies KIT Dispense based on patient and insurance preference. Use up to four times daily as directed. (FOR ICD-9 250.00, 250.01).  . hydrochlorothiazide (HYDRODIURIL) 25 MG tablet Take 1 tablet (25 mg total) by mouth daily.  . metFORMIN (GLUCOPHAGE-XR) 500 MG 24 hr tablet Take 2 tablets (1,000 mg total) by mouth daily with breakfast.    PHQ 2/9 Scores 06/11/2020 12/13/2019 10/16/2019 07/23/2019  PHQ - 2 Score 0 0 0 0  PHQ- 9 Score 0 0 0 -    GAD 7 : Generalized Anxiety Score 06/11/2020 12/13/2019  Nervous, Anxious, on Edge 0 0  Control/stop worrying 0 0  Worry too much - different things 0 0  Trouble relaxing 0 0  Restless 0 0  Easily annoyed or irritable 0 0  Afraid - awful might happen 0 0  Total GAD 7 Score 0 0  Anxiety Difficulty Not difficult at all Not difficult at all    BP Readings from Last 3 Encounters:  06/11/20 128/74  03/06/20 124/80  02/21/20 (!) 149/109    Physical Exam Vitals and nursing note reviewed.  Constitutional:      General: He is not in acute distress.    Appearance: He is well-developed.  HENT:     Head: Normocephalic and atraumatic.  Cardiovascular:     Rate and Rhythm: Normal rate and regular rhythm.     Pulses: Normal pulses.  Pulmonary:     Effort: Pulmonary effort is normal. No respiratory distress.     Breath sounds: No wheezing or rhonchi.  Musculoskeletal:     Cervical back: Normal range of motion.     Right lower leg: No edema.     Left lower leg: No edema.  Lymphadenopathy:     Cervical: No cervical adenopathy.  Skin:    General: Skin is warm and dry.     Findings: No rash.  Neurological:     Mental Status: He is alert and oriented to person, place, and time.  Psychiatric:        Mood and Affect: Mood and affect normal.        Behavior: Behavior normal.         Thought Content: Thought content normal.     Wt Readings from Last 3 Encounters:  06/11/20 237 lb (107.5 kg)  03/06/20 231 lb (104.8 kg)  02/21/20 233 lb 14.5 oz (106.1 kg)    BP 128/74   Pulse 71   Ht _0  (1.702 m)   Wt 237 lb (107.5 kg)   SpO2 97%   BMI 37.12 kg/m   Assessment and Plan: 1. Type II diabetes mellitus with complication (HCC) Clinically stable by exam and report without s/s of hypoglycemia. DM complicated by HTN. Tolerating medications - metformin -  well without side effects or other concerns. - Hemoglobin A1c  2. Essential hypertension Clinically  stable exam with well controlled BP on amlodipine and HCTZ. Tolerating medications without side effects at this time. Pt to continue current regimen and low sodium diet; benefits of regular exercise as able discussed.  3. Hyperlipidemia associated with type 2 diabetes mellitus ([place]) Lipids fairly well controlled on statin therapy without side effects Continue current dose of atorvastatin 10 mg daily   Partially dictated using Editor, commissioning. Any errors are unintentional.  Halina Maidens, MD [facility]  06/11/2020

## 2020-06-12 LAB — HEMOGLOBIN A1C
Est. average glucose Bld gHb Est-mCnc: 154 mg/dL
Hgb A1c MFr Bld: 7 % — ABNORMAL HIGH (ref 4.8–5.6)

## 2020-10-14 ENCOUNTER — Encounter: Payer: BC Managed Care – PPO | Admitting: Internal Medicine

## 2021-02-02 DIAGNOSIS — H10502 Unspecified blepharoconjunctivitis, left eye: Secondary | ICD-10-CM | POA: Diagnosis not present

## 2021-02-04 ENCOUNTER — Other Ambulatory Visit: Payer: Self-pay

## 2021-02-04 ENCOUNTER — Ambulatory Visit (INDEPENDENT_AMBULATORY_CARE_PROVIDER_SITE_OTHER): Payer: BC Managed Care – PPO | Admitting: Internal Medicine

## 2021-02-04 ENCOUNTER — Encounter: Payer: Self-pay | Admitting: Internal Medicine

## 2021-02-04 VITALS — BP 134/90 | HR 77 | Temp 98.0°F | Ht 67.0 in | Wt 235.0 lb

## 2021-02-04 DIAGNOSIS — Z125 Encounter for screening for malignant neoplasm of prostate: Secondary | ICD-10-CM

## 2021-02-04 DIAGNOSIS — I10 Essential (primary) hypertension: Secondary | ICD-10-CM

## 2021-02-04 DIAGNOSIS — E1169 Type 2 diabetes mellitus with other specified complication: Secondary | ICD-10-CM

## 2021-02-04 DIAGNOSIS — Z0000 Encounter for general adult medical examination without abnormal findings: Secondary | ICD-10-CM

## 2021-02-04 DIAGNOSIS — E785 Hyperlipidemia, unspecified: Secondary | ICD-10-CM | POA: Diagnosis not present

## 2021-02-04 DIAGNOSIS — Z23 Encounter for immunization: Secondary | ICD-10-CM

## 2021-02-04 DIAGNOSIS — E118 Type 2 diabetes mellitus with unspecified complications: Secondary | ICD-10-CM | POA: Diagnosis not present

## 2021-02-04 NOTE — Progress Notes (Signed)
Date:  02/04/2021   Name:  William [name]   DOB:  February 14, 1971   MRN:  [id number]   Chief Complaint: Annual Exam (Foot exam ) .William [name] is a 50 y.o. male who presents today for his Complete Annual Exam. He feels well. He reports exercising none. He reports he is sleeping well.   Colonoscopy: 08/2019  Immunization History  Administered Date(s) Administered   Hepatitis B 11/18/2014   Tdap 06/28/2013  He declines flu, covid, pneumovax and shingrix vaccines.  Hypertension This is a chronic problem. The problem is controlled (usually good at home 135/85). Pertinent negatives include no chest pain, headaches, palpitations or shortness of breath. Past treatments include diuretics and calcium channel blockers. The current treatment provides significant improvement.  Diabetes He presents for his follow-up diabetic visit. He has type 2 diabetes mellitus. His disease course has been stable. Pertinent negatives for hypoglycemia include no dizziness, headaches or nervousness/anxiousness. Pertinent negatives for diabetes include no chest pain and no fatigue. Current diabetic treatment includes oral agent (monotherapy). He is compliant with treatment all of the time. An ACE inhibitor/angiotensin II receptor blocker is not being taken.  Hyperlipidemia This is a chronic problem. The problem is controlled. Pertinent negatives include no chest pain, myalgias or shortness of breath. Current antihyperlipidemic treatment includes statins.   Lab Results  Component Value Date   CREATININE 0.98 02/20/2020   BUN 17 02/20/2020   NA 139 02/20/2020   K 4.0 02/20/2020   CL 98 02/20/2020   CO2 26 02/20/2020   Lab Results  Component Value Date   CHOL 157 10/16/2019   HDL 38 (L) 10/16/2019   LDLCALC 96 10/16/2019   TRIG 128 10/16/2019   CHOLHDL 4.1 10/16/2019   Lab Results  Component Value Date   TSH 2.490 06/24/2016   Lab Results  Component Value Date   HGBA1C 7.0 (H) 06/11/2020   Lab  Results  Component Value Date   WBC 6.8 07/23/2019   HGB 17.6 07/23/2019   HCT 50.6 07/23/2019   MCV 91 07/23/2019   PLT 239 07/23/2019   Lab Results  Component Value Date   ALT 65 (H) 10/16/2019   AST 50 (H) 10/16/2019   ALKPHOS 76 10/16/2019   BILITOT 0.7 10/16/2019     Review of Systems  Constitutional:  Negative for appetite change, chills, diaphoresis, fatigue and unexpected weight change.  HENT:  Negative for hearing loss, tinnitus, trouble swallowing and voice change.   Eyes:  Negative for visual disturbance.  Respiratory:  Negative for choking, shortness of breath and wheezing.   Cardiovascular:  Negative for chest pain, palpitations and leg swelling.  Gastrointestinal:  Negative for abdominal pain, blood in stool, constipation and diarrhea.  Genitourinary:  Negative for difficulty urinating, dysuria and frequency.  Musculoskeletal:  Negative for arthralgias, back pain and myalgias.  Skin:  Negative for color change and rash.  Neurological:  Negative for dizziness, syncope and headaches.  Hematological:  Negative for adenopathy.  Psychiatric/Behavioral:  Negative for dysphoric mood and sleep disturbance. The patient is not nervous/anxious.    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    No  Known Allergies  Past Surgical History:  Procedure Laterality Date   COLONOSCOPY WITH PROPOFOL N/A 08/21/2019   Procedure: COLONOSCOPY WITH BIOPSY;  Surgeon: Lin Landsman, MD;  Location: [facility];  Service: Endoscopy;  Laterality: N/A;  Requests late as possible   NO PAST SURGERIES      none     POLYPECTOMY N/A 08/21/2019   Procedure: POLYPECTOMY;  Surgeon: Lin Landsman, MD;  Location: [facility];  Service: Endoscopy;  Laterality: N/A;    Social History   Tobacco Use   Smoking status: Never   Smokeless tobacco: Never  Vaping Use   Vaping Use: Never used  Substance Use Topics   Alcohol use: Yes    Alcohol/week: 1.0 standard drink    Types: 1 Standard drinks or equivalent per week    Comment: couple times/year   Drug use: No     Medication list has been reviewed and updated.  Current Meds  Medication Sig   amLODipine (NORVASC) 5 MG tablet Take 1 tablet (5 mg total) by mouth daily.   aspirin 81 MG EC tablet Take by mouth.   atorvastatin (LIPITOR) 10 MG tablet Take 1 tablet (10 mg total) by mouth daily.   blood glucose meter kit and supplies KIT Dispense based on patient and insurance preference. Use up to four times daily as directed. (FOR ICD-9 250.00, 250.01).   hydrochlorothiazide (HYDRODIURIL) 25 MG tablet Take 1 tablet (25 mg total) by mouth daily.   metFORMIN (GLUCOPHAGE-XR) 500 MG 24 hr tablet Take 2 tablets (1,000 mg total) by mouth daily with breakfast.    PHQ 2/9 Scores 02/04/2021 06/11/2020 12/13/2019 10/16/2019  PHQ - 2 Score 0 0 0 0  PHQ- 9 Score 0 0 0 0    GAD 7 : Generalized Anxiety Score 02/04/2021 06/11/2020 12/13/2019  Nervous, Anxious, on Edge 0 0 0  Control/stop worrying 0 0 0  Worry too much - different things 0 0 0  Trouble relaxing 0 0 0  Restless 0 0 0  Easily annoyed or irritable 0 0 0  Afraid - awful might happen 0 0 0  Total GAD 7 Score 0 0 0  Anxiety Difficulty - Not difficult at all Not difficult at all    BP Readings from Last 3 Encounters:  02/04/21 134/90  06/11/20 128/74  03/06/20 124/80    Physical Exam Vitals and nursing note reviewed.  Constitutional:      Appearance: Normal appearance. He is well-developed. He is obese.  HENT:     Head: Normocephalic.     Right Ear: Tympanic membrane, ear canal and  external ear normal.     Left Ear: Tympanic membrane, ear canal and external ear normal.     Nose: Nose normal.  Eyes:     Conjunctiva/sclera: Conjunctivae normal.     Pupils: Pupils are equal, round, and reactive to light.  Neck:     Thyroid: No thyromegaly.     Vascular: No carotid bruit.  Cardiovascular:     Rate and Rhythm: Normal rate and regular rhythm.     Pulses: Normal pulses.     Heart sounds: Normal heart sounds.  Pulmonary:     Effort: Pulmonary effort is normal.     Breath sounds: Normal breath sounds. No wheezing.  Chest:  Breasts:    Right: No mass.     Left: No mass.  Abdominal:     General: Bowel sounds are normal.     Palpations: Abdomen is soft.  Tenderness: There is no abdominal tenderness.  Musculoskeletal:        General: Normal range of motion.     Cervical back: Normal range of motion and neck supple.     Right lower leg: No edema.     Left lower leg: No edema.  Lymphadenopathy:     Cervical: No cervical adenopathy.  Skin:    General: Skin is warm and dry.     Capillary Refill: Capillary refill takes less than 2 seconds.  Neurological:     General: No focal deficit present.     Mental Status: He is alert and oriented to person, place, and time.     Deep Tendon Reflexes: Reflexes are normal and symmetric.  Psychiatric:        Attention and Perception: Attention normal.        Mood and Affect: Mood normal.        Thought Content: Thought content normal.    Wt Readings from Last 3 Encounters:  02/04/21 235 lb (106.6 kg)  06/11/20 237 lb (107.5 kg)  03/06/20 231 lb (104.8 kg)    BP 134/90   Pulse 77   Temp 98 F (36.7 C) (Oral)   Ht '5\' 7"'  (1.702 m)   Wt 235 lb (106.6 kg)   SpO2 97%   BMI 36.81 kg/m   Assessment and Plan: 1. Annual physical exam Exam is normal except for weight. Encourage regular exercise and appropriate dietary changes. He declines all immunizations  2. Prostate cancer screening DRE deferred - PSA  3. Need  for vaccination for pneumococcus He declines  4. Essential hypertension Clinically stable exam with fairly well controlled BP. Will recheck next visit and consider adding losartan Tolerating medications without side effects at this time. Pt to continue current regimen and low sodium diet; benefits of regular exercise as able discussed. - CBC with Differential/Platelet  5. Type II diabetes mellitus with complication (HCC) Clinically stable by exam and report without s/s of hypoglycemia. DM complicated by hypertension and dyslipidemia. Tolerating medications well without side effects or other concerns. He is reminded to schedule DM eye exam Will get Microalb next visit - Comprehensive metabolic panel - Hemoglobin A1c  6. Hyperlipidemia associated with type 2 diabetes mellitus ([place]) Tolerating statin medication without side effects at this time LDL is almost at goal of < 70 on current dose Continue same therapy without change at this time. - Lipid panel   Partially dictated using Editor, commissioning. Any errors are unintentional.  Halina Maidens, MD [facility]  02/04/2021

## 2021-02-05 LAB — CBC WITH DIFFERENTIAL/PLATELET
Basophils Absolute: 0.1 10*3/uL (ref 0.0–0.2)
Basos: 1 %
EOS (ABSOLUTE): 0.5 10*3/uL — ABNORMAL HIGH (ref 0.0–0.4)
Eos: 6 %
Hematocrit: 53 % — ABNORMAL HIGH (ref 37.5–51.0)
Hemoglobin: 18.5 g/dL — ABNORMAL HIGH (ref 13.0–17.7)
Immature Grans (Abs): 0 10*3/uL (ref 0.0–0.1)
Immature Granulocytes: 0 %
Lymphocytes Absolute: 2.4 10*3/uL (ref 0.7–3.1)
Lymphs: 27 %
MCH: 31.2 pg (ref 26.6–33.0)
MCHC: 34.9 g/dL (ref 31.5–35.7)
MCV: 89 fL (ref 79–97)
Monocytes Absolute: 0.8 10*3/uL (ref 0.1–0.9)
Monocytes: 9 %
Neutrophils Absolute: 5 10*3/uL (ref 1.4–7.0)
Neutrophils: 57 %
Platelets: 242 10*3/uL (ref 150–450)
RBC: 5.93 x10E6/uL — ABNORMAL HIGH (ref 4.14–5.80)
RDW: 12.3 % (ref 11.6–15.4)
WBC: 8.8 10*3/uL (ref 3.4–10.8)

## 2021-02-05 LAB — COMPREHENSIVE METABOLIC PANEL
ALT: 52 IU/L — ABNORMAL HIGH (ref 0–44)
AST: 29 IU/L (ref 0–40)
Albumin/Globulin Ratio: 2.5 — ABNORMAL HIGH (ref 1.2–2.2)
Albumin: 4.3 g/dL (ref 3.8–4.9)
Alkaline Phosphatase: 59 IU/L (ref 44–121)
BUN/Creatinine Ratio: 13 (ref 9–20)
BUN: 13 mg/dL (ref 6–24)
Bilirubin Total: 0.8 mg/dL (ref 0.0–1.2)
CO2: 21 mmol/L (ref 20–29)
Calcium: 9.2 mg/dL (ref 8.7–10.2)
Chloride: 97 mmol/L (ref 96–106)
Creatinine, Ser: 1.02 mg/dL (ref 0.76–1.27)
Globulin, Total: 1.7 g/dL (ref 1.5–4.5)
Glucose: 163 mg/dL — ABNORMAL HIGH (ref 65–99)
Potassium: 4.2 mmol/L (ref 3.5–5.2)
Sodium: 136 mmol/L (ref 134–144)
Total Protein: 6 g/dL (ref 6.0–8.5)
eGFR: 89 mL/min/{1.73_m2} (ref >59)

## 2021-02-05 LAB — HEMOGLOBIN A1C
Est. average glucose Bld gHb Est-mCnc: 180 mg/dL
Hgb A1c MFr Bld: 7.9 % — ABNORMAL HIGH (ref 4.8–5.6)

## 2021-02-05 LAB — LIPID PANEL
Chol/HDL Ratio: 4.2 ratio (ref 0.0–5.0)
Cholesterol, Total: 143 mg/dL (ref 100–199)
HDL: 34 mg/dL — ABNORMAL LOW (ref >39)
LDL Chol Calc (NIH): 77 mg/dL (ref 0–99)
Triglycerides: 189 mg/dL — ABNORMAL HIGH (ref 0–149)
VLDL Cholesterol Cal: 32 mg/dL (ref 5–40)

## 2021-02-05 LAB — PSA: Prostate Specific Ag, Serum: 4.7 ng/mL — ABNORMAL HIGH (ref 0.0–4.0)

## 2021-02-06 ENCOUNTER — Other Ambulatory Visit: Payer: Self-pay

## 2021-02-06 DIAGNOSIS — R972 Elevated prostate specific antigen [PSA]: Secondary | ICD-10-CM

## 2021-02-06 MED ORDER — DAPAGLIFLOZIN PROPANEDIOL 10 MG PO TABS: 10.0000 mg | ORAL_TABLET | Freq: Every day | ORAL | 0 refills | Status: DC

## 2021-02-17 ENCOUNTER — Other Ambulatory Visit: Payer: Self-pay

## 2021-02-17 ENCOUNTER — Encounter: Payer: Self-pay | Admitting: Urology

## 2021-02-17 ENCOUNTER — Ambulatory Visit (INDEPENDENT_AMBULATORY_CARE_PROVIDER_SITE_OTHER): Payer: BC Managed Care – PPO | Admitting: Urology

## 2021-02-17 ENCOUNTER — Other Ambulatory Visit
Admission: RE | Admit: 2021-02-17 | Discharge: 2021-02-17 | Disposition: A | Discharge status: Home / Self Care | Payer: BC Managed Care – PPO | Attending: Urology | Admitting: Urology

## 2021-02-17 VITALS — BP 166/100 | HR 90 | Ht 67.0 in | Wt 236.2 lb

## 2021-02-17 DIAGNOSIS — N5201 Erectile dysfunction due to arterial insufficiency: Secondary | ICD-10-CM | POA: Diagnosis not present

## 2021-02-17 DIAGNOSIS — R972 Elevated prostate specific antigen [PSA]: Secondary | ICD-10-CM | POA: Diagnosis not present

## 2021-02-17 MED ORDER — TADALAFIL 5 MG PO TABS: 5.0000 mg | ORAL_TABLET | Freq: Every day | ORAL | 11 refills | Status: DC | PRN

## 2021-02-17 NOTE — Progress Notes (Signed)
02/17/21 9:10 AM   William [name] 12-21-1970 [id number]  CC: Elevated PSA, urinary frequency, ED  HPI: I saw William [name] in clinic for the above issues.  Is a 50 year old male with medical history notable for diabetes and hypertension who was recently found to have a mildly elevated PSA of 4.7 from a baseline of 1.5 three years ago.  He thinks he was riding a lawnmower prior to that blood draw.  He denies a family history of prostate or bladder cancer.  He has mild urinary frequency secondary to his diuretic but is minimally bothered by this.  He has ED with difficulty maintaining an erection, and has never tried medications for this.  He denies any gross hematuria or other urinary symptoms.  No prior imaging to review.   PMH: Past Medical History:  Diagnosis Date   Diabetes mellitus without complication ([place])    Hypertension    Seizure ([place])    childhood - none since age 29     Surgical History: Past Surgical History:  Procedure Laterality Date   COLONOSCOPY WITH PROPOFOL N/A 08/21/2019   Procedure: COLONOSCOPY WITH BIOPSY;  Surgeon: William Landsman, MD;  Location: [facility];  Service: Endoscopy;  Laterality: N/A;  Requests late as possible   NO PAST SURGERIES     none     POLYPECTOMY N/A 08/21/2019   Procedure: POLYPECTOMY;  Surgeon: William Landsman, MD;  Location: [facility];  Service: Endoscopy;  Laterality: N/A;    Family History: Family History  Problem Relation Age of Onset   CAD Father 1   Hypertension Father    Lung cancer Mother     Social History:  reports that he has never smoked. He has never used smokeless tobacco. He reports current alcohol use of about 1.0 standard drink per week. He reports that he does not use drugs.  Physical Exam: BP (!) 166/100 (BP Location: Left Arm, Patient Position: Sitting, Cuff Size: Large)   Pulse 90   Ht '5\' 7"'$  (1.702 m)   Wt 236 lb 3.2 oz (107.1 kg)   BMI 36.99 kg/m    Constitutional:  Alert and  oriented, No acute distress. Cardiovascular: No clubbing, cyanosis, or edema. Respiratory: Normal respiratory effort, no increased work of breathing. GI: Abdomen is soft, nontender, nondistended, no abdominal masses DRE: 40 g, smooth, no nodules or masses  Laboratory Data: Reviewed, see HPI  Pertinent Imaging: None to review  Assessment & Plan:   50 year old male with no family history of prostate or breast cancer and mildly isolated elevated PSA of 4.7 from baseline of 1.5, mild to moderate ED with difficulty maintaining erection, and mild urinary frequency secondary to diuretic use.  We reviewed the implications of an elevated PSA and the uncertainty surrounding it. In [name], a man's PSA increases with age and is produced by both normal and cancerous prostate tissue. The differential diagnosis for elevated PSA includes BPH, prostate cancer, infection, recent intercourse/ejaculation, recent urethroscopic manipulation (foley placement/cystoscopy) or trauma, and prostatitis.   Management of an elevated PSA can include observation or prostate biopsy and we discussed this in detail. Our goal is to detect clinically significant prostate cancers, and manage with either active surveillance, surgery, or radiation for localized disease. Risks of prostate biopsy include bleeding, infection (including life threatening sepsis), pain, and lower urinary symptoms. Hematuria, hematospermia, and blood in the stool are all common after biopsy and can persist up to 4 weeks.   In terms of his erections, I  recommended a trial of Cialis 5 mg either daily or on demand, and risks and benefits discussed at length.  -Trial of Cialis 5 mg for ED -Repeat PSA with reflex to free today, call with results.  If returns to normal would continue yearly screening, if significantly elevated consider biopsy or prostate MRI, and if stable consider MRI/biopsy or repeat PSA in 3 months  Nickolas Madrid, MD 02/17/2021  [facility] [address] [place], [place] [address] [phone]

## 2021-02-17 NOTE — Patient Instructions (Addendum)
Prostate Cancer Screening Prostate cancer screening is a test that is done to check for the presence of prostate cancer in men. The prostate gland is a walnut-sized gland that is located below the bladder and in front of the rectum in males. The function of the prostate is to add fluid to semen during ejaculation. Prostate cancer is the second most common type of cancer in men. Who should have prostate cancer screening? Screening recommendations vary based on age and other risk factors. Screening is recommended if: You are older than age 78. If you are age 36-69, talk with your health care provider about your need for screening and how often screening should be done. Because most prostate cancers are slow growing and will not cause death, screening is generally reserved in this age group for men who have a 10-15-year life expectancy. You are younger than age 62, and you have these risk factors: Being a [place] male or a male of African descent. Having a father, brother, or uncle who has been diagnosed with prostate cancer. The risk is higher if your family member's cancer occurred at an early age. Screening is not recommended if: You are younger than age 16. You are between the ages of 24 and 33 and you have no risk factors. You are 34 years of age or older. At this age, the risks that screening can cause are greater than the benefits that it may provide. If you are at high risk for prostate cancer, your health care provider may recommend that you have screenings more often or that you start screening at a younger age. How is screening for prostate cancer done? The recommended prostate cancer screening test is a blood test called the prostate-specific antigen (PSA) test. PSA is a protein that is made in the prostate. As you age, your prostate naturally produces more PSA. Abnormally high PSA levels may be caused by: Prostate cancer. An enlarged prostate that is not caused by cancer (benign prostatic  hyperplasia, BPH). This condition is very common in older men. A prostate gland infection (prostatitis). Depending on the PSA results, you may need more tests, such as: A physical exam to check the size of your prostate gland. Blood and imaging tests. A procedure to remove tissue samples from your prostate gland for testing (biopsy). What are the benefits of prostate cancer screening? Screening can help to identify cancer at an early stage, before symptoms start and when the cancer can be treated more easily. There is a small chance that screening may lower your risk of dying from prostate cancer. The chance is small because prostate cancer is a slow-growing cancer, and most men with prostate cancer die from a different cause. What are the risks of prostate cancer screening? The main risk of prostate cancer screening is diagnosing and treating prostate cancer that would never have caused any symptoms or problems. This is called overdiagnosisand overtreatment. PSA screening cannot tell you if your PSA is high due to cancer or a different cause. A prostate biopsy is the only procedure to diagnose prostate cancer. Even the results of a biopsy may not tell you if your cancer needs to be treated. Slow-growing prostate cancer may not need any treatment other than monitoring, so diagnosing and treating it may cause unnecessary stress or other side effects. Questions to ask your health care provider When should I start prostate cancer screening? What is my risk for prostate cancer? How often do I need screening? What type of screening tests do  I need? How do I get my test results? What do my results mean? Do I need treatment? Where to find more information The American Cancer Society: www.cancer.org American Urological Association: www.auanet.org Contact a health care provider if: You have difficulty urinating. You have pain when you urinate or ejaculate. You have blood in your urine or semen. You  have pain in your back or in the area of your prostate. Summary Prostate cancer is a common type of cancer in men. The prostate gland is located below the bladder and in front of the rectum. This gland adds fluid to semen during ejaculation. Prostate cancer screening may identify cancer at an early stage, when the cancer can be treated more easily. The prostate-specific antigen (PSA) test is the recommended screening test for prostate cancer. Discuss the risks and benefits of prostate cancer screening with your health care provider. If you are age 75 or older, the risks that screening can cause are greater than the benefits that it may provide. This information is not intended to replace advice given to you by your health care provider. Make sure you discuss any questions you have with your health care provider. Document Revised: 08/01/2020 Document Reviewed: 01/11/2019 Elsevier Patient Education  [place].  Tadalafil Tablets (Erectile Dysfunction, BPH) What is this medication? TADALAFIL (tah DA la fil) treats erectile dysfunction (ED). It works by increasing blood flow to the penis, which helps to maintain an erection. It may also be used to treat symptoms of an enlarged prostate (benign prostatic hyperplasia). This medicine may be used for other purposes; ask your health care provider or pharmacist if you have questions. COMMON BRAND NAME(S): Kathaleen Bury, Cialis What should I tell my care team before I take this medication? They need to know if you have any of these conditions: Anatomical deformation of the penis, Peyronie's disease, or history of priapism (painful and prolonged erection) Bleeding disorders Eye or vision problems, including a rare inherited eye disease called retinitis pigmentosa Heart disease, angina, a history of heart attack, irregular heart beats, or other heart problems High or low blood pressure History of blood diseases, like sickle cell anemia or  leukemia History of stomach bleeding Kidney disease Liver disease Stroke An unusual or allergic reaction to tadalafil, other medications, foods, dyes, or preservatives Pregnant or trying to get pregnant Breast-feeding How should I use this medication? Take this medication by mouth with a glass of water. Follow the directions on the prescription label. You may take this medication with or without meals. When this medication is used for erection problems, your care team may prescribe it to be taken once daily or as needed. If you are taking the medication as needed, you may be able to have sexual activity 30 minutes after taking it and for up to 36 hours after taking it. Whether you are taking the medication as needed or once daily, you should not take more than one dose per day. If you are taking this medication for symptoms of benign prostatic hyperplasia (BPH) or to treat both BPH and an erection problem, take the dose once daily at about the same time each day. Do not take your medication more often than directed. Talk to your care team about the use of this medication in children. Special care may be needed. Overdosage: If you think you have taken too much of this medicine contact a poison control center or emergency room at once. NOTE: This medicine is only for you. Do not share this  medicine with others. What if I miss a dose? If you are taking this medication as needed for erection problems, this does not apply. If you miss a dose while taking this medication once daily for an erection problem, benign prostatic hyperplasia, or both, take it as soon as you remember, but do not take more than one dose per day. What may interact with this medication? Do not take this medication with any of the following: Nitrates like amyl nitrite, isosorbide dinitrate, isosorbide mononitrate, nitroglycerin Other medications for erectile dysfunction like avanafil, sildenafil, vardenafil Other tadalafil products  (Adcirca) Riociguat This medication may also interact with the following: Certain medications for high blood pressure Certain medications for the treatment of HIV infection or AIDS Certain medications used for fungal or yeast infections, like fluconazole, itraconazole, ketoconazole, and voriconazole Certain medications used for seizures like carbamazepine, phenytoin, and phenobarbital Grapefruit juice Macrolide antibiotics like clarithromycin, erythromycin, troleandomycin Medications for prostate problems Rifabutin, rifampin or rifapentine This list may not describe all possible interactions. Give your health care provider a list of all the medicines, herbs, non-prescription drugs, or dietary supplements you use. Also tell them if you smoke, drink alcohol, or use illegal drugs. Some items may interact with your medicine. What should I watch for while using this medication? If you notice any changes in your vision while taking this medication, call your care team as soon as possible. Stop using this medication and call your care team right away if you have a loss of sight in one or both eyes. Contact your care team right away if the erection lasts longer than 4 hours or if it becomes painful. This may be a sign of serious problem and must be treated right away to prevent permanent damage. If you experience symptoms of nausea, dizziness, chest pain or arm pain upon initiation of sexual activity after taking this medication, you should refrain from further activity and call your care team as soon as possible. Do not drink alcohol to excess (examples, 5 glasses of wine or 5 shots of whiskey) when taking this medication. When taken in excess, alcohol can increase your chances of getting a headache or getting dizzy, increasing your heart rate or lowering your blood pressure. Using this medication does not protect you or your partner against HIV infection (the virus that causes AIDS) or other sexually  transmitted diseases. What side effects may I notice from receiving this medication? Side effects that you should report to your care team as soon as possible: Allergic reactions-skin rash, itching, hives, swelling of the face, lips, tongue, or throat Hearing loss or ringing in ears Heart attack-pain or tightness in the chest, shoulders, arms, or jaw, nausea, shortness of breath, cold or clammy skin, feeling faint or lightheaded Low blood pressure-dizziness, feeling faint or lightheaded, blurry vision Prolonged or painful erection Redness, blistering, peeling, or loosening of the skin, including inside the mouth Stroke-sudden numbness or weakness of the face, arm, or leg, trouble speaking, confusion, trouble walking, loss of balance or coordination, dizziness, severe headache, change in vision Sudden vision loss in one or both eyes Side effects that usually do not require medical attention (report to your care team if they continue or are bothersome): Back pain Facial flushing or redness Headache Muscle pain Runny or stuffy nose Upset stomach This list may not describe all possible side effects. Call your doctor for medical advice about side effects. You may report side effects to FDA at 1-800-FDA-1088. Where should I keep my medication? Keep out  of the reach of children. Store at room temperature between 15 and 30 degrees C (59 and 86 degrees F). Throw away any unused medication after the expiration date. NOTE: This sheet is a summary. It may not cover all possible information. If you have questions about this medicine, talk to your doctor, pharmacist, or health care provider.  2022 Elsevier/Gold Standard (2020-07-21 14:49:19)

## 2021-02-18 LAB — PSA, TOTAL AND FREE
PSA, Free Pct: 9.6 %
PSA, Free: 0.48 ng/mL
Prostate Specific Ag, Serum: 5 ng/mL — ABNORMAL HIGH (ref 0.0–4.0)

## 2021-02-20 ENCOUNTER — Telehealth: Payer: Self-pay

## 2021-02-20 NOTE — Telephone Encounter (Signed)
Called pt no answer. Left detailed message for pt per DPR informing him of the information below. Advised pt to call back for bx instructions and scheduling.

## 2021-02-20 NOTE — Telephone Encounter (Signed)
-----   Message from Billey Co, MD sent at 02/19/2021  9:16 AM EDT ----- PSA remains elevated at 5 from 4.7 previously, would recommend prostate biopsy as discussed in clinic. Please review instructions and schedule biopsy, thanks  Nickolas Madrid, MD 02/19/2021

## 2021-02-23 NOTE — Telephone Encounter (Signed)
Pt returns call informed him of the information below pt voiced understanding. Bx instructions sent via mychart. All questions answered.

## 2021-02-27 ENCOUNTER — Other Ambulatory Visit: Payer: Self-pay | Admitting: Internal Medicine

## 2021-02-27 DIAGNOSIS — I10 Essential (primary) hypertension: Secondary | ICD-10-CM

## 2021-02-28 NOTE — Telephone Encounter (Signed)
Requested Prescriptions  Pending Prescriptions Disp Refills  . hydrochlorothiazide (HYDRODIURIL) 25 MG tablet [Pharmacy Med Name: hydroCHLOROthiazide 25 MG Oral Tablet] 30 tablet 0    Sig: Take 1 tablet by mouth once daily     Cardiovascular: Diuretics - Thiazide Failed - 02/27/2021  7:24 PM      Failed - Last BP in normal range    BP Readings from Last 1 Encounters:  02/17/21 (!) 166/100         Passed - Ca in normal range and within 360 days    Calcium  Date Value Ref Range Status  02/04/2021 9.2 8.7 - 10.2 mg/dL Final   Calcium, Total  Date Value Ref Range Status  07/31/2012 9.3 8.5 - 10.1 mg/dL Final         Passed - Cr in normal range and within 360 days    Creatinine  Date Value Ref Range Status  07/31/2012 1.03 0.60 - 1.30 mg/dL Final   Creatinine, Ser  Date Value Ref Range Status  02/04/2021 1.02 0.76 - 1.27 mg/dL Final         Passed - K in normal range and within 360 days    Potassium  Date Value Ref Range Status  02/04/2021 4.2 3.5 - 5.2 mmol/L Final  07/31/2012 3.8 3.5 - 5.1 mmol/L Final         Passed - Na in normal range and within 360 days    Sodium  Date Value Ref Range Status  02/04/2021 136 134 - 144 mmol/L Final  07/31/2012 138 136 - 145 mmol/L Final         Passed - Valid encounter within last 6 months    Recent Outpatient Visits          3 weeks ago Annual physical exam   [facility] Glean Hess, MD   8 months ago Type II diabetes mellitus with complication [facility])   [facility] Glean Hess, MD   11 months ago Lesion of skin of foot   [facility] Glean Hess, MD   1 year ago Type II diabetes mellitus with complication [facility])   [facility] Glean Hess, MD   1 year ago Contusion of abdominal wall, initial encounter   [facility] Glean Hess, MD      Future Appointments            In 1 month [facility] Herbert Seta, MD [place]   In 3 months  [facility] Jesse Sans, MD [facility] [place]   In 11 months [facility] Jesse Sans, MD [facility]

## 2021-03-19 ENCOUNTER — Other Ambulatory Visit: Payer: Self-pay

## 2021-03-19 ENCOUNTER — Encounter: Payer: Self-pay | Admitting: Urology

## 2021-03-19 ENCOUNTER — Ambulatory Visit (INDEPENDENT_AMBULATORY_CARE_PROVIDER_SITE_OTHER): Payer: BC Managed Care – PPO | Admitting: Urology

## 2021-03-19 VITALS — BP 148/101 | HR 76 | Ht 67.0 in | Wt 227.6 lb

## 2021-03-19 DIAGNOSIS — R972 Elevated prostate specific antigen [PSA]: Secondary | ICD-10-CM

## 2021-03-19 MED ORDER — LEVOFLOXACIN 500 MG PO TABS
500.0000 mg | ORAL_TABLET | Freq: Once | ORAL | Status: AC
  Administered 2021-03-19: 500 mg via ORAL

## 2021-03-19 MED ORDER — GENTAMICIN SULFATE 40 MG/ML IJ SOLN
80.0000 mg | Freq: Once | INTRAMUSCULAR | Status: AC
  Administered 2021-03-19: 80 mg via INTRAMUSCULAR

## 2021-03-19 NOTE — Patient Instructions (Signed)
Transrectal Ultrasound-Guided Prostate Biopsy, Care After This sheet gives you information about how to care for yourself after your procedure. Your doctor may also give you more specific instructions. If youhave problems or questions, contact your doctor. What can I expect after the procedure? After the procedure, it is common to have: Pain and discomfort in your butt, especially while sitting. Pink-colored pee (urine), due to small amounts of blood in the pee. Burning while peeing (urinating). Blood in your poop (stool). Bleeding from your butt. Blood in your semen. Follow these instructions at home: Medicines Take over-the-counter and prescription medicines only as told by your doctor. If you were prescribed antibiotic medicine, take it as told by your doctor. Do not stop taking the antibiotic even if you start to feel better. Activity  Do not drive for 24 hours if you were given a medicine to help you relax (sedative) during your procedure. Return to your normal activities as told by your doctor. Ask your doctor what activities are safe for you. Ask your doctor when it is okay for you to have sex. Do not lift anything that is heavier than 10 lb (4.5 kg), or the limit that you are told, until your doctor says that it is safe.  General instructions  Drink enough water to keep your pee pale yellow. Watch your pee, poop, and semen for new bleeding or bleeding that gets worse. Keep all follow-up visits as told by your doctor. This is important.  Contact a doctor if you: Have blood clots in your pee or poop. Notice that your pee smells bad or unusual. Have very bad belly pain. Have trouble peeing. Notice that your lower belly feels firm. Have blood in your pee for more than 2 weeks after the procedure. Have blood in your semen for more than 2 months after the procedure. Have problems getting an erection. Feel sick to your stomach (nauseous) or throw up (vomit). Have new or worse  bleeding in your pee, poop, or semen. Get help right away if you: Have a fever or chills. Have bright red pee. Have very bad pain that does not get better with medicine. Cannot pee. Summary After this procedure, it is common to have pain and discomfort around your butt, especially while sitting. You may have blood in your pee and poop. It is common to have blood in your semen for 1-2 months. If you were prescribed antibiotic medicine, take it as told by your doctor. Do not stop taking the antibiotic even if you start to feel better. Get help right away if you have a fever or chills. This information is not intended to replace advice given to you by your health care provider. Make sure you discuss any questions you have with your healthcare provider. Document Revised: 04/14/2020 Document Reviewed: 02/14/2020 Elsevier Patient Education  2022 Elsevier Inc.  

## 2021-03-19 NOTE — Progress Notes (Signed)
   03/19/21  Indication: Elevated PSA, 5.0  Prostate Biopsy Procedure   Informed consent was obtained, and we discussed the risks of bleeding and infection/sepsis. A time out was performed to ensure correct patient identity.  Pre-Procedure: - Last PSA Level: 5.0, 9.6% free - Gentamicin and levaquin given for antibiotic prophylaxis - Transrectal Ultrasound performed revealing a 36 gm prostate, PSA density 0.14 - No significant hypoechoic or median lobe noted  Procedure: - Prostate block performed using 10 cc 1% lidocaine and biopsies taken from sextant areas, a total of 12 under ultrasound guidance.  Post-Procedure: - Patient tolerated the procedure well - He was counseled to seek immediate medical attention if experiences significant bleeding, fevers, or severe pain - Return in one week to discuss biopsy results  Assessment/ Plan: Will follow up in 1-2 weeks to discuss pathology  Nickolas Madrid, MD 03/19/2021

## 2021-03-19 NOTE — Addendum Note (Signed)
Addended by: Donalee Citrin on: 03/19/2021 10:19 AM   Modules accepted: Orders

## 2021-03-20 LAB — SURGICAL PATHOLOGY

## 2021-04-01 ENCOUNTER — Ambulatory Visit (INDEPENDENT_AMBULATORY_CARE_PROVIDER_SITE_OTHER): Payer: BC Managed Care – PPO | Admitting: Urology

## 2021-04-01 ENCOUNTER — Encounter: Payer: Self-pay | Admitting: Urology

## 2021-04-01 ENCOUNTER — Other Ambulatory Visit: Payer: Self-pay

## 2021-04-01 VITALS — BP 150/100 | HR 67 | Ht 67.0 in | Wt 231.0 lb

## 2021-04-01 DIAGNOSIS — N529 Male erectile dysfunction, unspecified: Secondary | ICD-10-CM | POA: Diagnosis not present

## 2021-04-01 DIAGNOSIS — R972 Elevated prostate specific antigen [PSA]: Secondary | ICD-10-CM

## 2021-04-01 NOTE — Patient Instructions (Signed)
Prostate Cancer Screening Prostate cancer screening is a test that is done to check for the presence of prostate cancer in men. The prostate gland is a walnut-sized gland that is located below the bladder and in front of the rectum in males. The function of the prostate is to add fluid to semen during ejaculation. Prostate cancer is the second most common type of cancer in men. Who should have prostate cancer screening? Screening recommendations vary based on age and other risk factors. Screening is recommended if: You are older than age 78. If you are age 52-69, talk with your health care provider about your need for screening and how often screening should be done. Because most prostate cancers are slow growing and will not cause death, screening is generally reserved in this age group for men who have a 10-15-year life expectancy. You are younger than age 52, and you have these risk factors: Being a [place] male or a male of African descent. Having a father, brother, or uncle who has been diagnosed with prostate cancer. The risk is higher if your family member's cancer occurred at an early age. Screening is not recommended if: You are younger than age 16. You are between the ages of 52 and 33 and you have no risk factors. You are 52 years of age or older. At this age, the risks that screening can cause are greater than the benefits that it may provide. If you are at high risk for prostate cancer, your health care provider may recommend that you have screenings more often or that you start screening at a younger age. How is screening for prostate cancer done? The recommended prostate cancer screening test is a blood test called the prostate-specific antigen (PSA) test. PSA is a protein that is made in the prostate. As you age, your prostate naturally produces more PSA. Abnormally high PSA levels may be caused by: Prostate cancer. An enlarged prostate that is not caused by cancer (benign prostatic  hyperplasia, BPH). This condition is very common in older men. A prostate gland infection (prostatitis). Depending on the PSA results, you may need more tests, such as: A physical exam to check the size of your prostate gland. Blood and imaging tests. A procedure to remove tissue samples from your prostate gland for testing (biopsy). What are the benefits of prostate cancer screening? Screening can help to identify cancer at an early stage, before symptoms start and when the cancer can be treated more easily. There is a small chance that screening may lower your risk of dying from prostate cancer. The chance is small because prostate cancer is a slow-growing cancer, and most men with prostate cancer die from a different cause. What are the risks of prostate cancer screening? The main risk of prostate cancer screening is diagnosing and treating prostate cancer that would never have caused any symptoms or problems. This is called overdiagnosisand overtreatment. PSA screening cannot tell you if your PSA is high due to cancer or a different cause. A prostate biopsy is the only procedure to diagnose prostate cancer. Even the results of a biopsy may not tell you if your cancer needs to be treated. Slow-growing prostate cancer may not need any treatment other than monitoring, so diagnosing and treating it may cause unnecessary stress or other side effects. Questions to ask your health care provider When should I start prostate cancer screening? What is my risk for prostate cancer? How often do I need screening? What type of screening tests do  I need? How do I get my test results? What do my results mean? Do I need treatment? Where to find more information The American Cancer Society: www.cancer.org American Urological Association: www.auanet.org Contact a health care provider if: You have difficulty urinating. You have pain when you urinate or ejaculate. You have blood in your urine or semen. You  have pain in your back or in the area of your prostate. Summary Prostate cancer is a common type of cancer in men. The prostate gland is located below the bladder and in front of the rectum. This gland adds fluid to semen during ejaculation. Prostate cancer screening may identify cancer at an early stage, when the cancer can be treated more easily. The prostate-specific antigen (PSA) test is the recommended screening test for prostate cancer. Discuss the risks and benefits of prostate cancer screening with your health care provider. If you are age 52 or older, the risks that screening can cause are greater than the benefits that it may provide. This information is not intended to replace advice given to you by your health care provider. Make sure you discuss any questions you have with your health care provider. Document Revised: 08/01/2020 Document Reviewed: 01/11/2019 Elsevier Patient Education  [place].

## 2021-04-01 NOTE — Progress Notes (Signed)
   04/01/2021 11:50 AM   William [name] January 29, 1971 [id number]  Reason for visit: Follow up prostate biopsy results, ED  HPI: 50 year old male who underwent a prostate biopsy on 03/19/2021 for an elevated PSA of 5(9.6% free).  This showed a 36 g prostate with a PSA density of 0.14, and pathology showed only benign prostatic tissue with some chronic inflammation.  He did well after the prostate biopsy and denies any residual symptoms.  We discussed the 20% risk of a false negative prostate biopsy and the need for ongoing PSA monitoring.  I recommended repeat PSA in 6 months, consider prostate MRI or 4K score if continues to rise.  He has erectile dysfunction, and we started him on Cialis 5 mg.  He currently is taking this daily with excellent results.  We discussed this could also be taken as needed.  RTC 6 months with PSA reflex to free prior   Billey Co, MD  [facility] [address] [place], [place] [address] [phone]

## 2021-04-13 ENCOUNTER — Other Ambulatory Visit: Payer: Self-pay | Admitting: Internal Medicine

## 2021-04-13 DIAGNOSIS — I10 Essential (primary) hypertension: Secondary | ICD-10-CM

## 2021-04-13 NOTE — Telephone Encounter (Signed)
Requested Prescriptions  Pending Prescriptions Disp Refills  . hydrochlorothiazide (HYDRODIURIL) 25 MG tablet [Pharmacy Med Name: hydroCHLOROthiazide 25 MG Oral Tablet] 90 tablet 1    Sig: Take 1 tablet by mouth once daily     Cardiovascular: Diuretics - Thiazide Failed - 04/13/2021  2:25 PM      Failed - Last BP in normal range    BP Readings from Last 1 Encounters:  04/01/21 (!) 150/100         Passed - Ca in normal range and within 360 days    Calcium  Date Value Ref Range Status  02/04/2021 9.2 8.7 - 10.2 mg/dL Final   Calcium, Total  Date Value Ref Range Status  07/31/2012 9.3 8.5 - 10.1 mg/dL Final         Passed - Cr in normal range and within 360 days    Creatinine  Date Value Ref Range Status  07/31/2012 1.03 0.60 - 1.30 mg/dL Final   Creatinine, Ser  Date Value Ref Range Status  02/04/2021 1.02 0.76 - 1.27 mg/dL Final         Passed - K in normal range and within 360 days    Potassium  Date Value Ref Range Status  02/04/2021 4.2 3.5 - 5.2 mmol/L Final  07/31/2012 3.8 3.5 - 5.1 mmol/L Final         Passed - Na in normal range and within 360 days    Sodium  Date Value Ref Range Status  02/04/2021 136 134 - 144 mmol/L Final  07/31/2012 138 136 - 145 mmol/L Final         Passed - Valid encounter within last 6 months    Recent Outpatient Visits          2 months ago Annual physical exam   [facility] Glean Hess, MD   10 months ago Type II diabetes mellitus with complication [facility])   [facility] Glean Hess, MD   1 year ago Lesion of skin of foot   [facility] Glean Hess, MD   1 year ago Type II diabetes mellitus with complication [facility])   [facility] Glean Hess, MD   1 year ago Contusion of abdominal wall, initial encounter   [facility] Glean Hess, MD      Future Appointments            In 1 month Army Melia Jesse Sans, MD [facility], [place]   In 10 months  Army Melia Jesse Sans, MD [facility], [facility]

## 2021-04-30 ENCOUNTER — Other Ambulatory Visit: Payer: Self-pay | Admitting: Internal Medicine

## 2021-04-30 ENCOUNTER — Other Ambulatory Visit: Payer: Self-pay

## 2021-04-30 ENCOUNTER — Telehealth: Payer: Self-pay

## 2021-04-30 DIAGNOSIS — E785 Hyperlipidemia, unspecified: Secondary | ICD-10-CM

## 2021-04-30 DIAGNOSIS — E118 Type 2 diabetes mellitus with unspecified complications: Secondary | ICD-10-CM

## 2021-04-30 DIAGNOSIS — I10 Essential (primary) hypertension: Secondary | ICD-10-CM

## 2021-04-30 DIAGNOSIS — E1169 Type 2 diabetes mellitus with other specified complication: Secondary | ICD-10-CM

## 2021-04-30 MED ORDER — ATORVASTATIN CALCIUM 10 MG PO TABS: 10.0000 mg | ORAL_TABLET | Freq: Every day | ORAL | 0 refills | Status: DC

## 2021-04-30 MED ORDER — HYDROCHLOROTHIAZIDE 25 MG PO TABS: 25.0000 mg | ORAL_TABLET | Freq: Every day | ORAL | 0 refills | Status: DC

## 2021-04-30 MED ORDER — AMLODIPINE BESYLATE 5 MG PO TABS
5.0000 mg | ORAL_TABLET | Freq: Every day | ORAL | 0 refills | Status: DC
Start: 2021-04-30 — End: 2021-06-11

## 2021-04-30 MED ORDER — METFORMIN HCL ER 500 MG PO TB24
1000.0000 mg | ORAL_TABLET | Freq: Every day | ORAL | 0 refills | Status: DC
Start: 2021-04-30 — End: 2021-10-13

## 2021-04-30 MED ORDER — DAPAGLIFLOZIN PROPANEDIOL 10 MG PO TABS: 10.0000 mg | ORAL_TABLET | Freq: Every day | ORAL | 0 refills | Status: DC

## 2021-04-30 NOTE — Telephone Encounter (Signed)
[facility]

## 2021-04-30 NOTE — Telephone Encounter (Signed)
These medications were ordered earlier today and receipt by pharmacy is noted on the chart. Duplicate requests-refused-not needed.

## 2021-04-30 NOTE — Telephone Encounter (Signed)
Copied from [place] [id number]. Topic: General - Other >> Apr 30, 2021  9:56 AM Greggory Keen D wrote: Pt called saying all of his prescriptions need to be sent to the mail order [facility] now.  He can not use the local pharmacy now per insurance.  CB#  [phone]

## 2021-06-10 ENCOUNTER — Encounter: Payer: Self-pay | Admitting: Internal Medicine

## 2021-06-10 DIAGNOSIS — R972 Elevated prostate specific antigen [PSA]: Secondary | ICD-10-CM | POA: Insufficient documentation

## 2021-06-11 ENCOUNTER — Encounter: Payer: Self-pay | Admitting: Internal Medicine

## 2021-06-11 ENCOUNTER — Other Ambulatory Visit: Payer: Self-pay

## 2021-06-11 ENCOUNTER — Ambulatory Visit (INDEPENDENT_AMBULATORY_CARE_PROVIDER_SITE_OTHER): Payer: BC Managed Care – PPO | Admitting: Internal Medicine

## 2021-06-11 VITALS — BP 138/88 | HR 76 | Ht 67.0 in | Wt 230.0 lb

## 2021-06-11 DIAGNOSIS — R972 Elevated prostate specific antigen [PSA]: Secondary | ICD-10-CM

## 2021-06-11 DIAGNOSIS — E118 Type 2 diabetes mellitus with unspecified complications: Secondary | ICD-10-CM

## 2021-06-11 DIAGNOSIS — I10 Essential (primary) hypertension: Secondary | ICD-10-CM | POA: Diagnosis not present

## 2021-06-11 LAB — POCT GLYCOSYLATED HEMOGLOBIN (HGB A1C): Hemoglobin A1C: 7.2 % — AB (ref 4.0–5.6)

## 2021-06-11 MED ORDER — AMLODIPINE BESYLATE 10 MG PO TABS: 10.0000 mg | ORAL_TABLET | Freq: Every day | ORAL | 1 refills | Status: DC

## 2021-06-11 NOTE — Progress Notes (Signed)
Date:  06/11/2021   Name:  William [name]   DOB:  March 31, 1971   MRN:  [id number]   Chief Complaint: Diabetes (Last BS 165 yesterday ) and Hypertension  Diabetes He presents for his follow-up diabetic visit. He has type 2 diabetes mellitus. His disease course has been improving. Pertinent negatives for hypoglycemia include no headaches or tremors. Pertinent negatives for diabetes include no chest pain, no fatigue, no polydipsia and no polyuria. Pertinent negatives for diabetic complications include no CVA. Current diabetic treatment includes oral agent (dual therapy) (metformin; farxiga added last visit). He is compliant with treatment most of the time. An ACE inhibitor/angiotensin II receptor blocker is not being taken. Eye exam is current.  Hypertension This is a chronic problem. The problem has been waxing and waning since onset. The problem is controlled. Pertinent negatives include no chest pain, headaches, palpitations or shortness of breath. Past treatments include diuretics and calcium channel blockers. There is no history of kidney disease, CAD/MI or CVA.   Lab Results  Component Value Date   NA 136 02/04/2021   K 4.2 02/04/2021   CO2 21 02/04/2021   GLUCOSE 163 (H) 02/04/2021   BUN 13 02/04/2021   CREATININE 1.02 02/04/2021   CALCIUM 9.2 02/04/2021   EGFR 89 02/04/2021   GFRNONAA 89 02/20/2020   Lab Results  Component Value Date   CHOL 143 02/04/2021   HDL 34 (L) 02/04/2021   LDLCALC 77 02/04/2021   TRIG 189 (H) 02/04/2021   CHOLHDL 4.2 02/04/2021   Lab Results  Component Value Date   TSH 2.490 06/24/2016   Lab Results  Component Value Date   HGBA1C 7.2 (A) 06/11/2021   Lab Results  Component Value Date   WBC 8.8 02/04/2021   HGB 18.5 (H) 02/04/2021   HCT 53.0 (H) 02/04/2021   MCV 89 02/04/2021   PLT 242 02/04/2021   Lab Results  Component Value Date   ALT 52 (H) 02/04/2021   AST 29 02/04/2021   ALKPHOS 59 02/04/2021   BILITOT 0.8 02/04/2021   No  results found for: 25OHVITD2, 25OHVITD3, VD25OH   Review of Systems  Constitutional:  Negative for appetite change, fatigue and unexpected weight change.  Eyes:  Negative for visual disturbance.  Respiratory:  Negative for cough, shortness of breath and wheezing.   Cardiovascular:  Negative for chest pain, palpitations and leg swelling.  Gastrointestinal:  Negative for abdominal pain and blood in stool.  Endocrine: Negative for polydipsia and polyuria.  Genitourinary:  Negative for dysuria and hematuria.  Skin:  Negative for color change and rash.  Neurological:  Negative for tremors, numbness and headaches.  Psychiatric/Behavioral:  Negative for dysphoric mood.    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    No Known Allergies  Past Surgical History:  Procedure Laterality Date   COLONOSCOPY WITH PROPOFOL N/A 08/21/2019   Procedure: COLONOSCOPY WITH BIOPSY;  Surgeon: Lin Landsman, MD;  Location: [facility];  Service: Endoscopy;  Laterality: N/A;  Requests late as possible   NO PAST SURGERIES     none     POLYPECTOMY  N/A 08/21/2019   Procedure: POLYPECTOMY;  Surgeon: Lin Landsman, MD;  Location: [facility];  Service: Endoscopy;  Laterality: N/A;    Social History   Tobacco Use   Smoking status: Never   Smokeless tobacco: Never  Vaping Use   Vaping Use: Never used  Substance Use Topics   Alcohol use: Yes    Alcohol/week: 1.0 standard drink    Types: 1 Standard drinks or equivalent per week    Comment: couple times/year   Drug use: No      Medication list has been reviewed and updated.  Current Meds  Medication Sig   aspirin 81 MG EC tablet Take by mouth.   atorvastatin (LIPITOR) 10 MG tablet Take 1 tablet (10 mg total) by mouth daily.   blood glucose meter kit and supplies KIT Dispense based on patient and insurance preference. Use up to four times daily as directed. (FOR ICD-9 250.00, 250.01).   dapagliflozin propanediol (FARXIGA) 10 MG TABS tablet Take 1 tablet (10 mg total) by mouth daily before breakfast.   hydrochlorothiazide (HYDRODIURIL) 25 MG tablet Take 1 tablet (25 mg total) by mouth daily.   metFORMIN (GLUCOPHAGE-XR) 500 MG 24 hr tablet Take 2 tablets (1,000 mg total) by mouth daily with breakfast.   tadalafil (CIALIS) 5 MG tablet Take 1 tablet (5 mg total) by mouth daily as needed for erectile dysfunction.   [DISCONTINUED] amLODipine (NORVASC) 5 MG tablet Take 1 tablet (5 mg total) by mouth daily.    PHQ 2/9 Scores 06/11/2021 02/04/2021 06/11/2020 12/13/2019  PHQ - 2 Score 0 0 0 0  PHQ- 9 Score 1 0 0 0    GAD 7 : Generalized Anxiety Score 06/11/2021 02/04/2021 06/11/2020 12/13/2019  Nervous, Anxious, on Edge 0 0 0 0  Control/stop worrying 0 0 0 0  Worry too much - different things 0 0 0 0  Trouble relaxing 0 0 0 0  Restless 0 0 0 0  Easily annoyed or irritable 0 0 0 0  Afraid - awful might happen 0 0 0 0  Total GAD 7 Score 0 0 0 0  Anxiety Difficulty - - Not difficult at all Not difficult at all    BP Readings from Last 3 Encounters:  06/11/21 138/88  04/01/21 (!) 150/100  03/19/21 (!) 148/101    Physical Exam Vitals and nursing note reviewed.  Constitutional:      General: He is not in acute distress.    Appearance: Normal appearance. He is well-developed.  HENT:     Head: Normocephalic and atraumatic.  Neck:     Vascular: No carotid bruit.  Cardiovascular:     Rate and Rhythm: Normal rate and regular rhythm.     Pulses: Normal pulses.  Pulmonary:     Effort: Pulmonary effort is normal.  No respiratory distress.     Breath sounds: No wheezing or rhonchi.  Musculoskeletal:     Cervical back: Normal range of motion.     Right lower leg: No edema.     Left lower leg: No edema.  Lymphadenopathy:     Cervical: No cervical adenopathy.  Skin:    General: Skin is warm and dry.     Findings: No rash.  Neurological:     Mental Status: He is alert and oriented to person, place, and time.  Psychiatric:        Mood and Affect: Mood normal.        Behavior: Behavior normal.    Wt Readings  from Last 3 Encounters:  06/11/21 230 lb (104.3 kg)  04/01/21 231 lb (104.8 kg)  03/19/21 227 lb 9.6 oz (103.2 kg)    BP 138/88    Pulse 76    Ht '5\' 7"'  (1.702 m)    Wt 230 lb (104.3 kg)    SpO2 96%    BMI 36.02 kg/m   Assessment and Plan: 1. Type II diabetes mellitus with complication (HCC) Clinically stable by exam and report without s/s of hypoglycemia. DM complicated by hypertension and dyslipidemia. Tolerating medications well without side effects or other concerns.  Much improved with Farxiga and 5 lb weight loss. - Microalbumin / creatinine urine ratio - POCT HgB A1C= 7.2 down from 7.9.  2. Essential hypertension BP suboptimally controlled.  Intolerant of ARB due to ED Will increase amlodipine to 10 mg. - Microalbumin / creatinine urine ratio - amLODipine (NORVASC) 10 MG tablet; Take 1 tablet (10 mg total) by mouth daily.  Dispense: 90 tablet; Refill: 1   Partially dictated using Editor, commissioning. Any errors are unintentional.  Halina Maidens, MD [facility]  06/11/2021

## 2021-06-12 LAB — MICROALBUMIN / CREATININE URINE RATIO
Creatinine, Urine: 102.5 mg/dL
Microalb/Creat Ratio: 14 mg/g creat (ref 0–29)
Microalbumin, Urine: 13.9 ug/mL

## 2021-09-22 ENCOUNTER — Other Ambulatory Visit: Payer: Self-pay | Admitting: *Deleted

## 2021-09-22 DIAGNOSIS — R972 Elevated prostate specific antigen [PSA]: Secondary | ICD-10-CM

## 2021-09-25 ENCOUNTER — Other Ambulatory Visit
Admission: RE | Admit: 2021-09-25 | Discharge: 2021-09-25 | Disposition: A | Discharge status: Home / Self Care | Payer: BC Managed Care – PPO | Attending: Urology | Admitting: Urology

## 2021-09-25 ENCOUNTER — Other Ambulatory Visit: Payer: BC Managed Care – PPO

## 2021-09-25 DIAGNOSIS — R972 Elevated prostate specific antigen [PSA]: Secondary | ICD-10-CM | POA: Diagnosis not present

## 2021-09-25 LAB — PSA
PSA: 4.11
Prostatic Specific Antigen: 4.11 ng/mL — ABNORMAL HIGH (ref 0.00–4.00)

## 2021-09-30 ENCOUNTER — Ambulatory Visit (INDEPENDENT_AMBULATORY_CARE_PROVIDER_SITE_OTHER): Payer: BC Managed Care – PPO | Admitting: Urology

## 2021-09-30 ENCOUNTER — Encounter: Payer: Self-pay | Admitting: Urology

## 2021-09-30 VITALS — BP 129/82 | HR 70 | Ht 67.0 in | Wt 228.0 lb

## 2021-09-30 DIAGNOSIS — N3281 Overactive bladder: Secondary | ICD-10-CM | POA: Diagnosis not present

## 2021-09-30 DIAGNOSIS — R972 Elevated prostate specific antigen [PSA]: Secondary | ICD-10-CM | POA: Diagnosis not present

## 2021-09-30 DIAGNOSIS — N5201 Erectile dysfunction due to arterial insufficiency: Secondary | ICD-10-CM

## 2021-09-30 MED ORDER — TADALAFIL 5 MG PO TABS: 5.0000 mg | ORAL_TABLET | Freq: Every day | ORAL | 11 refills | Status: DC | PRN

## 2021-09-30 NOTE — Progress Notes (Signed)
? ?  09/30/2021 ?10:13 AM  ? ?[place] ?01/05/1971 ?[id number] ? ?Reason for visit: Follow up elevated PSA, ED, overactive bladder symptoms ? ?HPI: ?51 year old male who underwent a prostate biopsy in October 2022 for an elevated PSA of 5(9.6% free) which showed a 36 g prostate with only benign prostatic tissue and some chronic inflammation.  Repeat PSA 09/25/2021 decreased slightly to 4.1.  We reviewed the AUA guidelines regarding PSA screening and the less than 20% risk of a false negative biopsy, and the need for ongoing PSA screening on a yearly basis. ? ?He continues to take Cialis 5 mg on demand with good results for his ED.  He reports some urinary symptoms of some urgency and frequency especially in the morning with his amlodipine and hydrochlorothiazide.  He also drinks a fair amount of soda during the day.  We reviewed behavioral strategies regarding overactive symptoms including avoiding bladder irritants, timed voiding.  We discussed medication options, and he would like to hold off at this time which is reasonable. ? ?Cialis refilled ?Behavioral strategies discussed regarding OAB ?RTC 1 year PSA reflex to free prior ? ? ?Billey Co, MD ? ?[facility] ?[address], [address] ?[place],  [place] [address] ?[phone] ? ? ?

## 2021-09-30 NOTE — Patient Instructions (Signed)

## 2021-10-01 ENCOUNTER — Ambulatory Visit: Payer: BC Managed Care – PPO | Admitting: Urology

## 2021-10-13 ENCOUNTER — Ambulatory Visit (INDEPENDENT_AMBULATORY_CARE_PROVIDER_SITE_OTHER): Payer: BC Managed Care – PPO | Admitting: Internal Medicine

## 2021-10-13 ENCOUNTER — Encounter: Payer: Self-pay | Admitting: Internal Medicine

## 2021-10-13 VITALS — BP 136/86 | HR 63 | Ht 67.0 in | Wt 232.0 lb

## 2021-10-13 DIAGNOSIS — E1169 Type 2 diabetes mellitus with other specified complication: Secondary | ICD-10-CM | POA: Diagnosis not present

## 2021-10-13 DIAGNOSIS — R972 Elevated prostate specific antigen [PSA]: Secondary | ICD-10-CM | POA: Diagnosis not present

## 2021-10-13 DIAGNOSIS — I10 Essential (primary) hypertension: Secondary | ICD-10-CM | POA: Diagnosis not present

## 2021-10-13 DIAGNOSIS — E785 Hyperlipidemia, unspecified: Secondary | ICD-10-CM

## 2021-10-13 DIAGNOSIS — E118 Type 2 diabetes mellitus with unspecified complications: Secondary | ICD-10-CM | POA: Diagnosis not present

## 2021-10-13 MED ORDER — HYDROCHLOROTHIAZIDE 25 MG PO TABS: 25.0000 mg | ORAL_TABLET | Freq: Every day | ORAL | 1 refills | Status: DC

## 2021-10-13 MED ORDER — DAPAGLIFLOZIN PROPANEDIOL 10 MG PO TABS: 10.0000 mg | ORAL_TABLET | Freq: Every day | ORAL | 1 refills | Status: DC

## 2021-10-13 MED ORDER — ATORVASTATIN CALCIUM 10 MG PO TABS: 10.0000 mg | ORAL_TABLET | Freq: Every day | ORAL | 1 refills | Status: DC

## 2021-10-13 MED ORDER — METFORMIN HCL ER 500 MG PO TB24: 1000.0000 mg | ORAL_TABLET | Freq: Every day | ORAL | 1 refills | Status: DC

## 2021-10-13 NOTE — Progress Notes (Signed)
? ? ?Date:  10/13/2021  ? ?Name:  William [name]   DOB:  Apr 05, 1971   MRN:  [id number] ? ? ?Chief Complaint: Diabetes and Hypertension ? ?Diabetes ?He presents for his follow-up diabetic visit. He has type 2 diabetes mellitus. His disease course has been improving. Pertinent negatives for hypoglycemia include no headaches or tremors. Pertinent negatives for diabetes include no chest pain, no fatigue, no polydipsia and no polyuria. Pertinent negatives for diabetic complications include no CVA. Current diabetic treatment includes oral agent (dual therapy) (metformin and farxiga). He is compliant with treatment all of the time. An ACE inhibitor/angiotensin II receptor blocker is not being taken. Eye exam is not current.  ?Hypertension ?This is a chronic problem. The problem is controlled. Pertinent negatives include no chest pain, headaches, palpitations or shortness of breath. Past treatments include calcium channel blockers and diuretics. The current treatment provides significant improvement. There is no history of kidney disease, CAD/MI or CVA.  ? ?Lab Results  ?Component Value Date  ? NA 136 02/04/2021  ? K 4.2 02/04/2021  ? CO2 21 02/04/2021  ? GLUCOSE 163 (H) 02/04/2021  ? BUN 13 02/04/2021  ? CREATININE 1.02 02/04/2021  ? CALCIUM 9.2 02/04/2021  ? EGFR 89 02/04/2021  ? GFRNONAA 89 02/20/2020  ? ?Lab Results  ?Component Value Date  ? CHOL 143 02/04/2021  ? HDL 34 (L) 02/04/2021  ? [place] 77 02/04/2021  ? TRIG 189 (H) 02/04/2021  ? CHOLHDL 4.2 02/04/2021  ? ?Lab Results  ?Component Value Date  ? TSH 2.490 06/24/2016  ? ?Lab Results  ?Component Value Date  ? HGBA1C 7.2 (A) 06/11/2021  ? ?Lab Results  ?Component Value Date  ? WBC 8.8 02/04/2021  ? HGB 18.5 (H) 02/04/2021  ? HCT 53.0 (H) 02/04/2021  ? MCV 89 02/04/2021  ? PLT 242 02/04/2021  ? ?Lab Results  ?Component Value Date  ? ALT 52 (H) 02/04/2021  ? AST 29 02/04/2021  ? ALKPHOS 59 02/04/2021  ? BILITOT 0.8 02/04/2021  ? ?No results found for: 25OHVITD2,  [place], VD25OH  ? ?Review of Systems  ?Constitutional:  Negative for appetite change, fatigue and unexpected weight change.  ?Eyes:  Negative for visual disturbance.  ?Respiratory:  Negative for cough, shortness of breath and wheezing.   ?Cardiovascular:  Negative for chest pain, palpitations and leg swelling.  ?Gastrointestinal:  Negative for abdominal pain and blood in stool.  ?Endocrine: Negative for polydipsia and polyuria.  ?Genitourinary:  Negative for dysuria and hematuria.  ?Skin:  Negative for color change and rash.  ?Neurological:  Negative for tremors, numbness and headaches.  ?Psychiatric/Behavioral:  Negative for dysphoric mood.   ? ?Patient Active Problem List  ? Diagnosis Date Noted  ? Elevated PSA 06/10/2021  ? Tubular adenoma of colon   ? Type II diabetes mellitus with complication ([place]) 33/35/4562  ? Hyperlipidemia associated with type 2 diabetes mellitus ([place]) 08/06/2016  ? Plantar fasciitis, bilateral 06/24/2016  ? Family history of premature coronary heart disease 05/28/2015  ? Essential hypertension 05/28/2015  ? Insomnia, persistent 05/28/2015  ? 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 ? ?No Known Allergies ? ?Past Surgical History:  ?Procedure Laterality Date  ? COLONOSCOPY WITH PROPOFOL N/A 08/21/2019  ? Procedure: COLONOSCOPY WITH BIOPSY;  Surgeon: Lin Landsman, MD;  Location: [place] [facility];  Service: Endoscopy;  Laterality: N/A;  Requests late as possible  ? NO PAST SURGERIES    ? none    ? POLYPECTOMY N/A 08/21/2019  ? Procedure: POLYPECTOMY;  Surgeon: Marius Ditch,  Tally Due, MD;  Location: [facility];  Service: Endoscopy;  Laterality: N/A;  ? ? ?Social History  ? ?Tobacco Use  ? Smoking status: Never  ? Smokeless tobacco: Never  ?Vaping Use  ? Vaping Use: Never used  ?Substance Use Topics  ? Alcohol use: Yes  ?  Alcohol/week: 1.0 standard drink  ?  Types: 1 Standard drinks or equivalent per week  ?  Comment: couple times/year  ? Drug use: No  ? ? ? ?Medication list has been reviewed  and updated. ? ?Current Meds  ?Medication Sig  ? amLODipine (NORVASC) 10 MG tablet Take 1 tablet (10 mg total) by mouth daily.  ? aspirin 81 MG EC tablet Take by mouth.  ? blood glucose meter kit and supplies KIT Dispense based on patient and insurance preference. Use up to four times daily as directed. (FOR ICD-9 250.00, 250.01).  ? tadalafil (CIALIS) 5 MG tablet Take 1 tablet (5 mg total) by mouth daily as needed for erectile dysfunction.  ? [DISCONTINUED] atorvastatin (LIPITOR) 10 MG tablet Take 1 tablet (10 mg total) by mouth daily.  ? [DISCONTINUED] dapagliflozin propanediol (FARXIGA) 10 MG TABS tablet Take 1 tablet (10 mg total) by mouth daily before breakfast.  ? [DISCONTINUED] hydrochlorothiazide (HYDRODIURIL) 25 MG tablet Take 1 tablet (25 mg total) by mouth daily.  ? [DISCONTINUED] metFORMIN (GLUCOPHAGE-XR) 500 MG 24 hr tablet Take 2 tablets (1,000 mg total) by mouth daily with breakfast.  ? ? ? ?  10/13/2021  ?  7:58 AM 06/11/2021  ?  8:02 AM 02/04/2021  ? 10:52 AM 06/11/2020  ?  8:16 AM  ?GAD 7 : Generalized Anxiety Score  ?Nervous, Anxious, on Edge 0 0 0 0  ?Control/stop worrying 0 0 0 0  ?Worry too much - different things 0 0 0 0  ?Trouble relaxing 0 0 0 0  ?Restless 0 0 0 0  ?Easily annoyed or irritable 0 0 0 0  ?Afraid - awful might happen 0 0 0 0  ?Total GAD 7 Score 0 0 0 0  ?Anxiety Difficulty    Not difficult at all  ? ? ? ?  10/13/2021  ?  7:58 AM  ?Depression screen PHQ 2/9  ?Decreased Interest 0  ?Down, Depressed, Hopeless 0  ?PHQ - 2 Score 0  ?Altered sleeping 0  ?Tired, decreased energy 0  ?Change in appetite 0  ?Feeling bad or failure about yourself  0  ?Trouble concentrating 0  ?Moving slowly or fidgety/restless 0  ?Suicidal thoughts 0  ?PHQ-9 Score 0  ?Difficult doing work/chores Not difficult at all  ? ? ?BP Readings from Last 3 Encounters:  ?10/13/21 136/86  ?09/30/21 129/82  ?06/11/21 138/88  ? ? ?Physical Exam ?Vitals and nursing note reviewed.  ?Constitutional:   ?   General: He is not in  acute distress. ?   Appearance: He is well-developed.  ?HENT:  ?   Head: Normocephalic and atraumatic.  ?Neck:  ?   Vascular: No carotid bruit.  ?Cardiovascular:  ?   Rate and Rhythm: Normal rate and regular rhythm.  ?Pulmonary:  ?   Effort: Pulmonary effort is normal. No respiratory distress.  ?   Breath sounds: No wheezing or rhonchi.  ?Musculoskeletal:  ?   Cervical back: Normal range of motion.  ?   Right lower leg: No edema.  ?   Left lower leg: No edema.  ?Lymphadenopathy:  ?   Cervical: No cervical adenopathy.  ?Skin: ?   General: Skin is  warm and dry.  ?   Findings: No rash.  ?Neurological:  ?   Mental Status: He is alert and oriented to person, place, and time.  ?Psychiatric:     ?   Mood and Affect: Mood normal.     ?   Behavior: Behavior normal.  ? ? ?Wt Readings from Last 3 Encounters:  ?10/13/21 232 lb (105.2 kg)  ?09/30/21 228 lb (103.4 kg)  ?06/11/21 230 lb (104.3 kg)  ? ? ?BP 136/86 (BP Location: Right Arm, Cuff Size: Large)   Pulse 63   Ht '5\' 7"'  (1.702 m)   Wt 232 lb (105.2 kg)   SpO2 95%   BMI 36.34 kg/m?  ? ?Assessment and Plan: ?1. Type II diabetes mellitus with complication ([place]) ?Clinically stable by exam and report without s/s of hypoglycemia. ?DM complicated by hypertension and dyslipidemia. ?Tolerating medications well without side effects or other concerns. ?He is reminded to schedule his eye exam. ?- Hemoglobin A1c ?- metFORMIN (GLUCOPHAGE-XR) 500 MG 24 hr tablet; Take 2 tablets (1,000 mg total) by mouth daily with breakfast.  Dispense: 180 tablet; Refill: 1 ? ?2. Essential hypertension ?Clinically stable exam with well controlled BP. ?Tolerating medications without side effects at this time. ?Pt to continue current regimen and low sodium diet; benefits of regular exercise as able discussed. ?- Basic metabolic panel ?- hydrochlorothiazide (HYDRODIURIL) 25 MG tablet; Take 1 tablet (25 mg total) by mouth daily.  Dispense: 90 tablet; Refill: 1 ? ?3. Hyperlipidemia associated with type 2  diabetes mellitus ([place]) ?Continue statin therapy. Check labs next visit. ?- atorvastatin (LIPITOR) 10 MG tablet; Take 1 tablet (10 mg total) by mouth daily.  Dispense: 90 tablet; Refill: 1 ? ? ?Partially dictat

## 2021-10-14 LAB — BASIC METABOLIC PANEL
BUN/Creatinine Ratio: 14 (ref 9–20)
BUN: 14 mg/dL (ref 6–24)
CO2: 25 mmol/L (ref 20–29)
Calcium: 9.2 mg/dL (ref 8.7–10.2)
Chloride: 96 mmol/L (ref 96–106)
Creatinine, Ser: 1.03 mg/dL (ref 0.76–1.27)
Glucose: 277 mg/dL — ABNORMAL HIGH (ref 70–99)
Potassium: 4.3 mmol/L (ref 3.5–5.2)
Sodium: 134 mmol/L (ref 134–144)
eGFR: 87 mL/min/{1.73_m2} (ref >59)

## 2021-10-14 LAB — HEMOGLOBIN A1C
Est. average glucose Bld gHb Est-mCnc: 194 mg/dL
Hgb A1c MFr Bld: 8.4 % — ABNORMAL HIGH (ref 4.8–5.6)

## 2021-11-11 ENCOUNTER — Encounter: Payer: Self-pay | Admitting: Internal Medicine

## 2021-11-11 LAB — HM DIABETES EYE EXAM

## 2022-01-08 ENCOUNTER — Other Ambulatory Visit: Payer: Self-pay | Admitting: *Deleted

## 2022-01-08 DIAGNOSIS — N5201 Erectile dysfunction due to arterial insufficiency: Secondary | ICD-10-CM

## 2022-01-08 MED ORDER — TADALAFIL 5 MG PO TABS: 5.0000 mg | ORAL_TABLET | Freq: Every day | ORAL | 3 refills | Status: DC | PRN

## 2022-02-08 ENCOUNTER — Other Ambulatory Visit
Admission: RE | Admit: 2022-02-08 | Discharge: 2022-02-08 | Disposition: A | Discharge status: Home / Self Care | Payer: BC Managed Care – PPO | Attending: Internal Medicine | Admitting: Internal Medicine

## 2022-02-08 ENCOUNTER — Encounter: Payer: Self-pay | Admitting: Internal Medicine

## 2022-02-08 ENCOUNTER — Ambulatory Visit (INDEPENDENT_AMBULATORY_CARE_PROVIDER_SITE_OTHER): Payer: BC Managed Care – PPO | Admitting: Internal Medicine

## 2022-02-08 VITALS — BP 110/72 | HR 72 | Ht 67.0 in | Wt 216.0 lb

## 2022-02-08 DIAGNOSIS — Z0000 Encounter for general adult medical examination without abnormal findings: Secondary | ICD-10-CM

## 2022-02-08 DIAGNOSIS — R972 Elevated prostate specific antigen [PSA]: Secondary | ICD-10-CM | POA: Insufficient documentation

## 2022-02-08 DIAGNOSIS — E118 Type 2 diabetes mellitus with unspecified complications: Secondary | ICD-10-CM

## 2022-02-08 DIAGNOSIS — E1169 Type 2 diabetes mellitus with other specified complication: Secondary | ICD-10-CM | POA: Diagnosis not present

## 2022-02-08 DIAGNOSIS — E785 Hyperlipidemia, unspecified: Secondary | ICD-10-CM

## 2022-02-08 DIAGNOSIS — I10 Essential (primary) hypertension: Secondary | ICD-10-CM | POA: Insufficient documentation

## 2022-02-08 LAB — LIPID PANEL
Cholesterol: 132 mg/dL (ref 0–200)
HDL: 41 mg/dL (ref >40)
LDL Cholesterol: 66 mg/dL (ref 0–99)
Total CHOL/HDL Ratio: 3.2 RATIO
Triglycerides: 124 mg/dL (ref <150)
VLDL: 25 mg/dL (ref 0–40)

## 2022-02-08 LAB — CBC WITH DIFFERENTIAL/PLATELET
Abs Immature Granulocytes: 0.02 10*3/uL (ref 0.00–0.07)
Basophils Absolute: 0.1 10*3/uL (ref 0.0–0.1)
Basophils Relative: 1 %
Eosinophils Absolute: 0.3 10*3/uL (ref 0.0–0.5)
Eosinophils Relative: 4 %
HCT: 45 % (ref 39.0–52.0)
Hemoglobin: 16.4 g/dL (ref 13.0–17.0)
Immature Granulocytes: 0 %
Lymphocytes Relative: 24 %
Lymphs Abs: 2.1 10*3/uL (ref 0.7–4.0)
MCH: 31.2 pg (ref 26.0–34.0)
MCHC: 36.4 g/dL — ABNORMAL HIGH (ref 30.0–36.0)
MCV: 85.7 fL (ref 80.0–100.0)
Monocytes Absolute: 0.8 10*3/uL (ref 0.1–1.0)
Monocytes Relative: 9 %
Neutro Abs: 5.5 10*3/uL (ref 1.7–7.7)
Neutrophils Relative %: 62 %
Platelets: 242 10*3/uL (ref 150–400)
RBC: 5.25 MIL/uL (ref 4.22–5.81)
RDW: 12.2 % (ref 11.5–15.5)
WBC: 8.7 10*3/uL (ref 4.0–10.5)
nRBC: 0 % (ref 0.0–0.2)

## 2022-02-08 LAB — COMPREHENSIVE METABOLIC PANEL
ALT: 34 U/L (ref 0–44)
AST: 23 U/L (ref 15–41)
Albumin: 4.3 g/dL (ref 3.5–5.0)
Alkaline Phosphatase: 58 U/L (ref 38–126)
Anion gap: 9 (ref 5–15)
BUN: 23 mg/dL — ABNORMAL HIGH (ref 6–20)
CO2: 24 mmol/L (ref 22–32)
Calcium: 8.9 mg/dL (ref 8.9–10.3)
Chloride: 100 mmol/L (ref 98–111)
Creatinine, Ser: 0.89 mg/dL (ref 0.61–1.24)
GFR, Estimated: >60 mL/min (ref >60)
Glucose, Bld: 137 mg/dL — ABNORMAL HIGH (ref 70–99)
Potassium: 3.3 mmol/L — ABNORMAL LOW (ref 3.5–5.1)
Sodium: 133 mmol/L — ABNORMAL LOW (ref 135–145)
Total Bilirubin: 1.1 mg/dL (ref 0.3–1.2)
Total Protein: 7.1 g/dL (ref 6.5–8.1)

## 2022-02-08 LAB — HEMOGLOBIN A1C
Hgb A1c MFr Bld: 7.6 % — ABNORMAL HIGH (ref 4.8–5.6)
Mean Plasma Glucose: 171.42 mg/dL

## 2022-02-08 MED ORDER — BLOOD GLUCOSE MONITOR KIT: PACK | 0 refills | Status: DC

## 2022-02-08 MED ORDER — METFORMIN HCL ER 500 MG PO TB24: 1000.0000 mg | ORAL_TABLET | Freq: Every day | ORAL | 1 refills | Status: DC

## 2022-02-08 MED ORDER — ATORVASTATIN CALCIUM 10 MG PO TABS: 10.0000 mg | ORAL_TABLET | Freq: Every day | ORAL | 1 refills | Status: DC

## 2022-02-08 MED ORDER — AMLODIPINE BESYLATE 10 MG PO TABS: 10.0000 mg | ORAL_TABLET | Freq: Every day | ORAL | 1 refills | Status: DC

## 2022-02-08 MED ORDER — DAPAGLIFLOZIN PROPANEDIOL 10 MG PO TABS: 10.0000 mg | ORAL_TABLET | Freq: Every day | ORAL | 1 refills | Status: DC

## 2022-02-08 MED ORDER — HYDROCHLOROTHIAZIDE 25 MG PO TABS: 25.0000 mg | ORAL_TABLET | Freq: Every day | ORAL | 1 refills | Status: DC

## 2022-02-08 NOTE — Progress Notes (Signed)
Date:  02/08/2022   Name:  William [name]   DOB:  December 24, 1970   MRN:  [id number]   Chief Complaint: Annual Exam William [name] is a 51 y.o. male who presents today for his Complete Annual Exam. He feels well. He reports working for exercise. He reports he is sleeping well. He has significantly changed his diet and has lost 16 lbs.  Colonoscopy: 08/2019 repeat 7 yrs  Immunization History  Administered Date(s) Administered   Hepatitis B 11/18/2014   Tdap 06/28/2013   Health Maintenance Due  Topic Date Due   HIV Screening  Never done    Lab Results  Component Value Date   PSA1 5.0 (H) 02/17/2021   PSA1 4.7 (H) 02/04/2021   PSA1 1.4 07/12/2017   PSA 4.11 09/25/2021    Hypertension This is a chronic problem. The problem is controlled. Pertinent negatives include no chest pain, headaches, palpitations or shortness of breath. Past treatments include calcium channel blockers and diuretics. The current treatment provides significant improvement.  Diabetes He presents for his follow-up diabetic visit. He has type 2 diabetes mellitus. His disease course has been worsening. Pertinent negatives for hypoglycemia include no dizziness, headaches or nervousness/anxiousness. Pertinent negatives for diabetes include no chest pain and no fatigue. Current diabetic treatments: metformin, farxiga. An ACE inhibitor/angiotensin II receptor blocker is not being taken.  Hyperlipidemia This is a chronic problem. The problem is controlled. Pertinent negatives include no chest pain, myalgias or shortness of breath. Current antihyperlipidemic treatment includes statins. The current treatment provides moderate improvement of lipids.    Lab Results  Component Value Date   NA 134 10/13/2021   K 4.3 10/13/2021   CO2 25 10/13/2021   GLUCOSE 277 (H) 10/13/2021   BUN 14 10/13/2021   CREATININE 1.03 10/13/2021   CALCIUM 9.2 10/13/2021   EGFR 87 10/13/2021   GFRNONAA 89 02/20/2020   Lab Results   Component Value Date   CHOL 143 02/04/2021   HDL 34 (L) 02/04/2021   LDLCALC 77 02/04/2021   TRIG 189 (H) 02/04/2021   CHOLHDL 4.2 02/04/2021   Lab Results  Component Value Date   TSH 2.490 06/24/2016   Lab Results  Component Value Date   HGBA1C 8.4 (H) 10/13/2021   Lab Results  Component Value Date   WBC 8.8 02/04/2021   HGB 18.5 (H) 02/04/2021   HCT 53.0 (H) 02/04/2021   MCV 89 02/04/2021   PLT 242 02/04/2021   Lab Results  Component Value Date   ALT 52 (H) 02/04/2021   AST 29 02/04/2021   ALKPHOS 59 02/04/2021   BILITOT 0.8 02/04/2021   No results found for: "25OHVITD2", "25OHVITD3", "VD25OH"   Review of Systems  Constitutional:  Negative for appetite change, chills, diaphoresis, fatigue and unexpected weight change.  HENT:  Negative for hearing loss, tinnitus, trouble swallowing and voice change.   Eyes:  Negative for visual disturbance.  Respiratory:  Negative for choking, shortness of breath and wheezing.   Cardiovascular:  Negative for chest pain, palpitations and leg swelling.  Gastrointestinal:  Negative for abdominal pain, blood in stool, constipation and diarrhea.  Genitourinary:  Negative for difficulty urinating, dysuria and frequency.  Musculoskeletal:  Negative for arthralgias, back pain and myalgias.  Skin:  Negative for color change and rash.  Neurological:  Negative for dizziness, syncope and headaches.  Hematological:  Negative for adenopathy.  Psychiatric/Behavioral:  Negative for dysphoric mood and sleep disturbance. The patient is not nervous/anxious.     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    No Known Allergies  Past Surgical History:  Procedure Laterality Date   COLONOSCOPY WITH PROPOFOL N/A 08/21/2019   Procedure: COLONOSCOPY WITH BIOPSY;  Surgeon: Lin Landsman, MD;  Location: [facility];  Service: Endoscopy;  Laterality: N/A;  Requests late as possible   NO PAST SURGERIES     none     POLYPECTOMY N/A 08/21/2019   Procedure: POLYPECTOMY;  Surgeon: Lin Landsman, MD;  Location: [facility];  Service: Endoscopy;  Laterality: N/A;    Social History   Tobacco Use   Smoking status: Never   Smokeless tobacco: Never  Vaping Use   Vaping Use: Never used  Substance Use Topics   Alcohol use: Yes    Alcohol/week: 1.0 standard drink of alcohol    Types: 1 Standard drinks or equivalent per week    Comment: couple times/year   Drug use: No     Medication list has been reviewed and updated.  Current Meds  Medication Sig   aspirin 81 MG EC tablet Take by mouth.   tadalafil (CIALIS) 5 MG tablet Take 1 tablet (5 mg total) by mouth daily as needed for erectile dysfunction.   [DISCONTINUED] amLODipine (NORVASC) 10 MG tablet Take 1 tablet (10 mg total) by mouth daily.   [DISCONTINUED] atorvastatin (LIPITOR) 10 MG tablet Take 1 tablet (10 mg total) by mouth daily.   [DISCONTINUED] blood glucose meter kit and supplies KIT Dispense based on patient and insurance preference. Use up to four times daily as directed. (FOR ICD-9 250.00, 250.01).   [DISCONTINUED] dapagliflozin propanediol (FARXIGA) 10 MG TABS tablet Take 1 tablet (10 mg total) by mouth daily before breakfast.   [DISCONTINUED] hydrochlorothiazide (HYDRODIURIL) 25 MG tablet Take 1 tablet (25 mg total) by mouth daily.   [DISCONTINUED] metFORMIN (GLUCOPHAGE-XR) 500 MG 24 hr tablet Take 2 tablets (1,000 mg total) by mouth daily with breakfast.       02/08/2022    8:38 AM 10/13/2021    7:58 AM 06/11/2021    8:02 AM 02/04/2021   10:52 AM   GAD 7 : Generalized Anxiety Score  Nervous, Anxious, on Edge 0 0 0 0  Control/stop worrying 0 0 0 0  Worry too much - different things 0 0 0 0  Trouble relaxing 0 0 0 0  Restless 0 0 0 0  Easily annoyed or irritable 0 0 0 0  Afraid - awful might happen 0 0 0 0  Total GAD 7 Score 0 0 0 0  Anxiety Difficulty Not difficult at all          02/08/2022    8:38 AM 10/13/2021    7:58 AM 06/11/2021    8:02 AM  Depression screen PHQ 2/9  Decreased Interest 0 0 0  Down, Depressed, Hopeless 0 0 0  PHQ - 2 Score 0 0 0  Altered sleeping 0 0 0  Tired, decreased energy 0 0 1  Change in appetite 0 0 0  Feeling bad or failure about yourself  0 0 0  Trouble concentrating 0 0 0  Moving slowly or fidgety/restless 0 0 0  Suicidal thoughts 0 0 0  PHQ-9 Score 0 0 1  Difficult doing work/chores Not difficult at all Not difficult at all Not difficult at all    BP Readings from Last 3 Encounters:  02/08/22 110/72  10/13/21 136/86  09/30/21 129/82    Physical Exam Vitals and nursing note reviewed.  Constitutional:      Appearance: Normal appearance. He is well-developed.  HENT:     Head: Normocephalic.     Right Ear: Tympanic membrane, ear canal and external ear normal.     Left Ear: Tympanic membrane, ear canal and external ear normal.     Nose: Nose normal.  Eyes:     Conjunctiva/sclera: Conjunctivae normal.     Pupils: Pupils are equal, round, and reactive to light.  Neck:     Thyroid: No thyromegaly.     Vascular: No carotid bruit.  Cardiovascular:     Rate and Rhythm: Normal rate and regular rhythm.     Heart sounds: Normal heart sounds.  Pulmonary:     Effort: Pulmonary effort is normal.     Breath sounds: Normal breath sounds. No wheezing.  Chest:  Breasts:    Right: No mass.     Left: No mass.  Abdominal:     General: Bowel sounds are normal.     Palpations: Abdomen is soft.     Tenderness: There is no abdominal tenderness.  Musculoskeletal:        General: Normal range  of motion.     Cervical back: Normal range of motion and neck supple.  Lymphadenopathy:     Cervical: No cervical adenopathy.  Skin:    General: Skin is warm and dry.  Neurological:     Mental Status: He is alert and oriented to person, place, and time.     Deep Tendon Reflexes: Reflexes are normal and symmetric.  Psychiatric:        Attention and Perception: Attention normal.        Mood and Affect: Mood normal.        Thought Content: Thought content normal.    Diabetic Foot Exam - Simple   Simple Foot Form Diabetic Foot exam was performed with the following findings: Yes 02/08/2022  8:57 AM  Visual Inspection Sensation Testing Intact to touch and monofilament testing bilaterally: Yes Pulse Check Posterior Tibialis and Dorsalis pulse intact bilaterally: Yes Comments      Wt Readings from Last 3 Encounters:  02/08/22 216 lb (98 kg)  10/13/21 232 lb (105.2 kg)  09/30/21 228 lb (103.4 kg)    BP 110/72   Pulse 72   Ht _0  (1.702 m)   Wt 216 lb (98 kg)   SpO2 96%   BMI 33.83 kg/m   Assessment and Plan: 1. Annual physical exam Exam is normal except for weight. Encourage regular exercise and appropriate dietary changes ongoing.   He has lost 16 lbs recently with effort. Colonoscopy next year. He declines Shingrix.  2. Essential hypertension Clinically stable exam with well controlled BP. Tolerating medications without side effects at this time. Pt to continue current regimen and low sodium diet; benefits of regular exercise as able discussed. - CBC with Differential/Platelet - amLODipine (NORVASC) 10 MG tablet; Take 1 tablet (10 mg total) by mouth daily.  Dispense: 90 tablet; Refill: 1 - hydrochlorothiazide (HYDRODIURIL) 25 MG tablet; Take 1 tablet (25 mg total) by mouth daily.  Dispense: 90 tablet; Refill: 1  3.  Hyperlipidemia associated with type 2 diabetes mellitus ([place]) Tolerating statin medication without side effects at this time LDL is not at goal of < 70  on current dose but have improved with diet changes. Continue same therapy without change at this time. Consider increasing the statin if needed. - Lipid panel - atorvastatin (LIPITOR) 10 MG tablet; Take 1 tablet (10 mg total) by mouth daily.  Dispense: 90 tablet; Refill: 1  4. Type II diabetes mellitus with complication (HCC) Clinically stable by exam and report without s/s of hypoglycemia. DM complicated by hypertension and dyslipidemia. Tolerating medications well without side effects or other concerns. Will re-order glucometer. - Comprehensive metabolic panel - Hemoglobin A1c - Microalbumin / creatinine urine ratio - dapagliflozin propanediol (FARXIGA) 10 MG TABS tablet; Take 1 tablet (10 mg total) by mouth daily before breakfast.  Dispense: 90 tablet; Refill: 1 - metFORMIN (GLUCOPHAGE-XR) 500 MG 24 hr tablet; Take 2 tablets (1,000 mg total) by mouth daily with breakfast.  Dispense: 180 tablet; Refill: 1  5. Elevated PSA Followed by Urology.   Partially dictated using Editor, commissioning. Any errors are unintentional.  Halina Maidens, MD [facility]  02/08/2022

## 2022-02-08 NOTE — Addendum Note (Signed)
Addended by: Delia Heady on: 02/08/2022 09:23 AM   Modules accepted: Orders

## 2022-02-09 LAB — MICROALBUMIN / CREATININE URINE RATIO
Creatinine, Urine: 78.1 mg/dL
Microalb Creat Ratio: 8 mg/g creat (ref 0–29)
Microalb, Ur: 6.4 ug/mL — ABNORMAL HIGH

## 2022-03-18 ENCOUNTER — Other Ambulatory Visit: Payer: Self-pay | Admitting: Internal Medicine

## 2022-03-18 DIAGNOSIS — I10 Essential (primary) hypertension: Secondary | ICD-10-CM

## 2022-03-18 NOTE — Telephone Encounter (Signed)
last RF 02/08/22 #90 1 RF -   Requested Prescriptions  Refused Prescriptions Disp Refills  . amLODipine (NORVASC) 10 MG tablet [Pharmacy Med Name: AMLODIPINE BESYLATE TABS '10MG'$ ] 90 tablet 3    Sig: TAKE 1 TABLET DAILY     Cardiovascular: Calcium Channel Blockers 2 Passed - 03/18/2022 12:20 AM      Passed - Last BP in normal range    BP Readings from Last 1 Encounters:  02/08/22 110/72         Passed - Last Heart Rate in normal range    Pulse Readings from Last 1 Encounters:  02/08/22 72         Passed - Valid encounter within last 6 months    Recent Outpatient Visits          1 month ago Annual physical exam   [facility] at [facility], Jesse Sans, MD   5 months ago Type II diabetes mellitus with complication [facility])   [facility] [facility] at [facility], Jesse Sans, MD   9 months ago Type II diabetes mellitus with complication [facility])   [place] [facility] at [facility], Jesse Sans, MD   1 year ago Annual physical exam   [facility] at [facility], Jesse Sans, MD   1 year ago Type II diabetes mellitus with complication [facility])   [facility] [facility] at [facility], Jesse Sans, MD      Future Appointments            In 2 months [facility], Jesse Sans, MD [facility] at [facility], [facility]   In 6 months [facility], Herbert Seta, [facility]   In 11 months [facility] Jesse Sans, MD [place] [facility] at [facility], [facility]

## 2022-06-10 ENCOUNTER — Encounter: Payer: Self-pay | Admitting: Internal Medicine

## 2022-06-10 ENCOUNTER — Ambulatory Visit (INDEPENDENT_AMBULATORY_CARE_PROVIDER_SITE_OTHER): Payer: BC Managed Care – PPO | Admitting: Internal Medicine

## 2022-06-10 ENCOUNTER — Other Ambulatory Visit: Payer: Self-pay

## 2022-06-10 VITALS — BP 132/82 | HR 59 | Ht 67.0 in | Wt 216.0 lb

## 2022-06-10 DIAGNOSIS — E1169 Type 2 diabetes mellitus with other specified complication: Secondary | ICD-10-CM | POA: Diagnosis not present

## 2022-06-10 DIAGNOSIS — I10 Essential (primary) hypertension: Secondary | ICD-10-CM

## 2022-06-10 DIAGNOSIS — E118 Type 2 diabetes mellitus with unspecified complications: Secondary | ICD-10-CM

## 2022-06-10 DIAGNOSIS — E785 Hyperlipidemia, unspecified: Secondary | ICD-10-CM

## 2022-06-10 LAB — POCT GLYCOSYLATED HEMOGLOBIN (HGB A1C): Hemoglobin A1C: 7.2 % — AB (ref 4.0–5.6)

## 2022-06-10 MED ORDER — HYDROCHLOROTHIAZIDE 25 MG PO TABS: 25.0000 mg | ORAL_TABLET | Freq: Every day | ORAL | 1 refills | Status: DC

## 2022-06-10 MED ORDER — AMLODIPINE BESYLATE 10 MG PO TABS: 10.0000 mg | ORAL_TABLET | Freq: Every day | ORAL | 1 refills | Status: DC

## 2022-06-10 MED ORDER — DAPAGLIFLOZIN PROPANEDIOL 10 MG PO TABS: 10.0000 mg | ORAL_TABLET | Freq: Every day | ORAL | 1 refills | Status: DC

## 2022-06-10 MED ORDER — ATORVASTATIN CALCIUM 10 MG PO TABS: 10.0000 mg | ORAL_TABLET | Freq: Every day | ORAL | 1 refills | Status: DC

## 2022-06-10 MED ORDER — ACCU-CHEK SOFTCLIX LANCETS MISC: 12 refills | Status: DC

## 2022-06-10 MED ORDER — BLOOD GLUCOSE MONITOR KIT: PACK | 0 refills | Status: AC

## 2022-06-10 MED ORDER — METFORMIN HCL ER 500 MG PO TB24: 1000.0000 mg | ORAL_TABLET | Freq: Every day | ORAL | 1 refills | Status: DC

## 2022-06-10 MED ORDER — BLOOD GLUCOSE TEST STRIPS 333 VI STRP: ORAL_STRIP | 6 refills | Status: AC

## 2022-06-10 NOTE — Progress Notes (Signed)
Date:  06/10/2022   Name:  William [name]   DOB:  January 07, 1971   MRN:  [id number]   Chief Complaint: No chief complaint on file.  Hypertension This is a chronic problem. The problem is controlled. Pertinent negatives include no chest pain, headaches, palpitations or shortness of breath. Past treatments include calcium channel blockers and diuretics. The current treatment provides significant improvement. There is no history of kidney disease, CAD/MI or CVA.  Diabetes He presents for his follow-up diabetic visit. He has type 2 diabetes mellitus. His disease course has been stable. Pertinent negatives for hypoglycemia include no headaches, nervousness/anxiousness or tremors. Pertinent negatives for diabetes include no chest pain, no fatigue, no polydipsia and no polyuria. Pertinent negatives for diabetic complications include no CVA. Current diabetic treatments: metformin and Farxiga.    Lab Results  Component Value Date   NA 133 (L) 02/08/2022   K 3.3 (L) 02/08/2022   CO2 24 02/08/2022   GLUCOSE 137 (H) 02/08/2022   BUN 23 (H) 02/08/2022   CREATININE 0.89 02/08/2022   CALCIUM 8.9 02/08/2022   EGFR 87 10/13/2021   GFRNONAA >60 02/08/2022   Lab Results  Component Value Date   CHOL 132 02/08/2022   HDL 41 02/08/2022   LDLCALC 66 02/08/2022   TRIG 124 02/08/2022   CHOLHDL 3.2 02/08/2022   Lab Results  Component Value Date   TSH 2.490 06/24/2016   Lab Results  Component Value Date   HGBA1C 7.2 (A) 06/10/2022   Lab Results  Component Value Date   WBC 8.7 02/08/2022   HGB 16.4 02/08/2022   HCT 45.0 02/08/2022   MCV 85.7 02/08/2022   PLT 242 02/08/2022   Lab Results  Component Value Date   ALT 34 02/08/2022   AST 23 02/08/2022   ALKPHOS 58 02/08/2022   BILITOT 1.1 02/08/2022   No results found for: "25OHVITD2", "25OHVITD3", "VD25OH"   Review of Systems  Constitutional:  Negative for appetite change, fatigue and unexpected weight change.  Eyes:  Negative for  visual disturbance.  Respiratory:  Negative for cough, shortness of breath and wheezing.   Cardiovascular:  Negative for chest pain, palpitations and leg swelling.  Gastrointestinal:  Negative for abdominal pain and blood in stool.  Endocrine: Negative for polydipsia and polyuria.  Genitourinary:  Negative for dysuria and hematuria.  Skin:  Negative for color change and rash.  Neurological:  Negative for tremors, numbness and headaches.  Psychiatric/Behavioral:  Negative for dysphoric mood and sleep disturbance. The patient is not nervous/anxious.     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    No Known Allergies  Past Surgical History:  Procedure Laterality Date   COLONOSCOPY WITH PROPOFOL N/A 08/21/2019   Procedure: COLONOSCOPY WITH BIOPSY;  Surgeon: Lin Landsman, MD;  Location: [place];  Service: Endoscopy;  Laterality: N/A;  Requests late as possible   NO PAST SURGERIES     none     POLYPECTOMY N/A 08/21/2019   Procedure: POLYPECTOMY;  Surgeon: Lin Landsman, MD;  Location: [place];  Service: Endoscopy;  Laterality: N/A;    Social  History   Tobacco Use   Smoking status: Never   Smokeless tobacco: Never  Vaping Use   Vaping Use: Never used  Substance Use Topics   Alcohol use: Yes    Alcohol/week: 1.0 standard drink of alcohol    Types: 1 Standard drinks or equivalent per week    Comment: couple times/year   Drug use: No     Medication list has been reviewed and updated.  Current Meds  Medication Sig   amLODipine (NORVASC) 10 MG  tablet Take 1 tablet (10 mg total) by mouth daily.   aspirin 81 MG EC tablet Take by mouth.   blood glucose meter kit and supplies KIT Dispense based on patient and insurance preference. Use up to four times daily as directed.   tadalafil (CIALIS) 5 MG tablet Take 1 tablet (5 mg total) by mouth daily as needed for erectile dysfunction.   [DISCONTINUED] amLODipine (NORVASC) 10 MG tablet Take 1 tablet (10 mg total) by mouth daily.   [DISCONTINUED] atorvastatin (LIPITOR) 10 MG tablet Take 1 tablet (10 mg total) by mouth daily.   [DISCONTINUED] blood glucose meter kit and supplies KIT Dispense based on patient and insurance preference. Use up to four times daily as directed. (FOR ICD-9 250.00, 250.01).   [DISCONTINUED] dapagliflozin propanediol (FARXIGA) 10 MG TABS tablet Take 1 tablet (10 mg total) by mouth daily before breakfast.   [DISCONTINUED] hydrochlorothiazide (HYDRODIURIL) 25 MG tablet Take 1 tablet (25 mg total) by mouth daily.   [DISCONTINUED] metFORMIN (GLUCOPHAGE-XR) 500 MG 24 hr tablet Take 2 tablets (1,000 mg total) by mouth daily with breakfast.       06/10/2022    8:38 AM 06/10/2022    8:37 AM 02/08/2022    8:38 AM 10/13/2021    7:58 AM  GAD 7 : Generalized Anxiety Score  Nervous, Anxious, on Edge 0 0 0 0  Control/stop worrying 0 0 0 0  Worry too much - different things 0 0 0 0  Trouble relaxing 0 0 0 0  Restless 0 0 0 0  Easily annoyed or irritable 0 0 0 0  Afraid - awful might happen 0 0 0 0  Total GAD 7 Score 0 0 0 0  Anxiety Difficulty Not difficult at all Not difficult at all Not difficult at all        06/10/2022    8:38 AM 06/10/2022    8:37 AM 02/08/2022    8:38 AM  Depression screen PHQ 2/9  Decreased Interest 0 0 0  Down, Depressed, Hopeless 0 0 0  PHQ - 2 Score 0 0 0  Altered sleeping 0 0 0  Tired, decreased energy 0 0 0  Change in appetite 0 0 0  Feeling bad or failure about yourself  0 0 0  Trouble concentrating 0 0 0  Moving slowly or  fidgety/restless 0 0 0  Suicidal thoughts 0 0 0  PHQ-9 Score 0 0 0  Difficult doing work/chores Not difficult at all Not difficult at all Not difficult at all    BP Readings from Last 3 Encounters:  06/10/22 132/82  02/08/22 110/72  10/13/21 136/86    Physical Exam Vitals and nursing note reviewed.  Constitutional:      General: He is not in acute distress.    Appearance: He is well-developed.  HENT:     Head: Normocephalic and atraumatic.  Neck:     Vascular: No carotid bruit.  Cardiovascular:     Rate and Rhythm: Normal rate  and regular rhythm.  Pulmonary:     Effort: Pulmonary effort is normal. No respiratory distress.     Breath sounds: No wheezing or rhonchi.  Musculoskeletal:     Cervical back: Normal range of motion.  Lymphadenopathy:     Cervical: No cervical adenopathy.  Skin:    General: Skin is warm and dry.     Findings: No rash.  Neurological:     Mental Status: He is alert and oriented to person, place, and time.  Psychiatric:        Mood and Affect: Mood normal.        Behavior: Behavior normal.     Wt Readings from Last 3 Encounters:  06/10/22 216 lb (98 kg)  02/08/22 216 lb (98 kg)  10/13/21 232 lb (105.2 kg)    BP 132/82   Pulse (!) 59   Ht _0  (1.702 m)   Wt 216 lb (98 kg)   SpO2 96%   BMI 33.83 kg/m   Assessment and Plan: Problem List Items Addressed This Visit       Cardiovascular and Mediastinum   Essential hypertension - Primary (Chronic)    Clinically stable exam with well controlled BP. Tolerating medications without side effects at this time. Pt to continue current regimen and low sodium diet; benefits of regular exercise as able discussed.       Relevant Medications   amLODipine (NORVASC) 10 MG tablet   atorvastatin (LIPITOR) 10 MG tablet   hydrochlorothiazide (HYDRODIURIL) 25 MG tablet     Endocrine   Hyperlipidemia associated with type 2 diabetes mellitus (HCC) (Chronic)   Relevant Medications   amLODipine  (NORVASC) 10 MG tablet   metFORMIN (GLUCOPHAGE-XR) 500 MG 24 hr tablet   dapagliflozin propanediol (FARXIGA) 10 MG TABS tablet   atorvastatin (LIPITOR) 10 MG tablet   hydrochlorothiazide (HYDRODIURIL) 25 MG tablet   Type II diabetes mellitus with complication (HCC) (Chronic)    Clinically stable by exam and report without s/s of hypoglycemia. DM complicated by hypertension and dyslipidemia. Tolerating medications well without side effects or other concerns. A1c down from 7.6 to 7.2      Relevant Medications   metFORMIN (GLUCOPHAGE-XR) 500 MG 24 hr tablet   dapagliflozin propanediol (FARXIGA) 10 MG TABS tablet   atorvastatin (LIPITOR) 10 MG tablet   blood glucose meter kit and supplies KIT   Other Relevant Orders   POCT glycosylated hemoglobin (Hb A1C) (Completed)     Partially dictated using Editor, commissioning. Any errors are unintentional.  Halina Maidens, MD [facility]  06/10/2022

## 2022-06-10 NOTE — Assessment & Plan Note (Addendum)
Clinically stable by exam and report without s/s of hypoglycemia. DM complicated by hypertension and dyslipidemia. Tolerating medications well without side effects or other concerns. A1c down from 7.6 to 7.2

## 2022-06-10 NOTE — Assessment & Plan Note (Signed)
Clinically stable exam with well controlled BP. Tolerating medications without side effects at this time. Pt to continue current regimen and low sodium diet; benefits of regular exercise as able discussed.

## 2022-07-06 ENCOUNTER — Other Ambulatory Visit: Payer: Self-pay | Admitting: Internal Medicine

## 2022-07-06 ENCOUNTER — Telehealth: Payer: Self-pay

## 2022-07-06 DIAGNOSIS — E118 Type 2 diabetes mellitus with unspecified complications: Secondary | ICD-10-CM

## 2022-07-06 MED ORDER — FREESTYLE LITE DEVI: 1.0000 | Freq: Two times a day (BID) | Status: AC

## 2022-07-06 NOTE — Telephone Encounter (Signed)
Called pt left VM to call back to let us know if he has got his glucose monitor and supplies from the pharmacy. Pt name was stated on VM.  KP

## 2022-09-28 ENCOUNTER — Other Ambulatory Visit
Admission: RE | Admit: 2022-09-28 | Discharge: 2022-09-28 | Disposition: A | Discharge status: Home / Self Care | Payer: BC Managed Care – PPO | Attending: Urology | Admitting: Urology

## 2022-09-28 ENCOUNTER — Encounter: Payer: Self-pay | Admitting: Urology

## 2022-09-28 ENCOUNTER — Ambulatory Visit (INDEPENDENT_AMBULATORY_CARE_PROVIDER_SITE_OTHER): Payer: BC Managed Care – PPO | Admitting: Urology

## 2022-09-28 VITALS — BP 148/88 | HR 68 | Ht 67.0 in | Wt 219.4 lb

## 2022-09-28 DIAGNOSIS — N5201 Erectile dysfunction due to arterial insufficiency: Secondary | ICD-10-CM

## 2022-09-28 DIAGNOSIS — N529 Male erectile dysfunction, unspecified: Secondary | ICD-10-CM | POA: Diagnosis not present

## 2022-09-28 DIAGNOSIS — R350 Frequency of micturition: Secondary | ICD-10-CM | POA: Diagnosis not present

## 2022-09-28 DIAGNOSIS — R972 Elevated prostate specific antigen [PSA]: Secondary | ICD-10-CM

## 2022-09-28 DIAGNOSIS — R3915 Urgency of urination: Secondary | ICD-10-CM | POA: Diagnosis not present

## 2022-09-28 DIAGNOSIS — R399 Unspecified symptoms and signs involving the genitourinary system: Secondary | ICD-10-CM

## 2022-09-28 MED ORDER — TADALAFIL 5 MG PO TABS: 5.0000 mg | ORAL_TABLET | Freq: Every day | ORAL | 3 refills | Status: DC

## 2022-09-28 NOTE — Patient Instructions (Signed)

## 2022-09-28 NOTE — Progress Notes (Signed)
   09/28/2022 9:18 AM   Mickel Duhamel 1971-05-28 [id number]  Reason for visit: Follow up elevated PSA, ED, LUTS  HPI: 52 year old male who underwent a prostate biopsy in October 2022 for an elevated PSA of 5(9.6% free) which showed a 36 g prostate with only benign prostatic tissue and some chronic inflammation.  Repeat PSA 09/25/2021 decreased slightly to 4.1.  We reviewed the AUA guidelines regarding PSA screening and the less than 20% risk of a false negative biopsy, and the need for ongoing PSA screening on a yearly basis.  PSA has not been drawn this year, will check today and call with results.  He continues to take Cialis 5 mg on demand with good results for his ED.   He previously has reported some urgency and frequency of urination, but at our visit previously we had discussed cutting back on sodas and diet drinks and the symptoms have improved.  He has some mild frequency when he takes his diuretic, but denies any significant nocturia, no urgency, no incontinence.  Overall, minimally bothered by urinary symptoms.  We reviewed the impact of diabetes, diuretics, and bladder irritants on urinary symptoms.  He is not bothered enough to consider medications at this time.   Cialis 5 mg daily refilled, sent to [email or web address] PSA reflex to free today, call with results.  If >6 consider repeat PSA in 1 month and if remains elevated prostate MRI, if stable continue yearly screening   Sondra Come, MD  [facility] [address] [place], [place] [address] [phone]

## 2022-09-30 ENCOUNTER — Other Ambulatory Visit: Payer: Self-pay

## 2022-09-30 DIAGNOSIS — Z87898 Personal history of other specified conditions: Secondary | ICD-10-CM

## 2022-09-30 LAB — FPSA% REFLEX
% FREE PSA: 10.2 %
PSA, FREE: 0.52 ng/mL

## 2022-09-30 LAB — PSA (REFLEX TO FREE) (SERIAL): Prostate Specific Ag, Serum: 5.1 ng/mL — ABNORMAL HIGH (ref 0.0–4.0)

## 2022-10-11 ENCOUNTER — Encounter: Payer: Self-pay | Admitting: Internal Medicine

## 2022-10-11 ENCOUNTER — Ambulatory Visit (INDEPENDENT_AMBULATORY_CARE_PROVIDER_SITE_OTHER): Payer: BC Managed Care – PPO | Admitting: Internal Medicine

## 2022-10-11 VITALS — BP 118/76 | HR 67 | Ht 67.0 in | Wt 221.0 lb

## 2022-10-11 DIAGNOSIS — E785 Hyperlipidemia, unspecified: Secondary | ICD-10-CM

## 2022-10-11 DIAGNOSIS — E1169 Type 2 diabetes mellitus with other specified complication: Secondary | ICD-10-CM | POA: Diagnosis not present

## 2022-10-11 DIAGNOSIS — Z23 Encounter for immunization: Secondary | ICD-10-CM | POA: Diagnosis not present

## 2022-10-11 DIAGNOSIS — E118 Type 2 diabetes mellitus with unspecified complications: Secondary | ICD-10-CM

## 2022-10-11 DIAGNOSIS — I10 Essential (primary) hypertension: Secondary | ICD-10-CM

## 2022-10-11 MED ORDER — DAPAGLIFLOZIN PROPANEDIOL 10 MG PO TABS: 10.0000 mg | ORAL_TABLET | Freq: Every day | ORAL | 1 refills | Status: DC

## 2022-10-11 MED ORDER — AMLODIPINE BESYLATE 10 MG PO TABS: 10.0000 mg | ORAL_TABLET | Freq: Every day | ORAL | 1 refills | Status: DC

## 2022-10-11 MED ORDER — METFORMIN HCL ER 500 MG PO TB24: 1000.0000 mg | ORAL_TABLET | Freq: Every day | ORAL | 1 refills | Status: DC

## 2022-10-11 MED ORDER — ATORVASTATIN CALCIUM 10 MG PO TABS: 10.0000 mg | ORAL_TABLET | Freq: Every day | ORAL | 1 refills | Status: DC

## 2022-10-11 MED ORDER — HYDROCHLOROTHIAZIDE 25 MG PO TABS: 25.0000 mg | ORAL_TABLET | Freq: Every day | ORAL | 1 refills | Status: DC

## 2022-10-11 NOTE — Progress Notes (Signed)
Date:  10/11/2022   Name:  William [name]   DOB:  1970-09-18   MRN:  [id number]   Chief Complaint: Diabetes  Diabetes He presents for his follow-up diabetic visit. He has type 2 diabetes mellitus. His disease course has been stable. Pertinent negatives for hypoglycemia include no headaches or tremors. Pertinent negatives for diabetes include no chest pain, no fatigue, no polydipsia and no polyuria. Current diabetic treatments: metformin and farxiga. His weight is stable. An ACE inhibitor/angiotensin II receptor blocker is not being taken. Eye exam is current.  Hypertension This is a chronic problem. The problem has been waxing and waning since onset. Pertinent negatives include no chest pain, headaches, palpitations or shortness of breath. Past treatments include diuretics and calcium channel blockers. The current treatment provides moderate improvement. There are no compliance problems.   Hyperlipidemia This is a chronic problem. The problem is controlled. Pertinent negatives include no chest pain or shortness of breath. Current antihyperlipidemic treatment includes statins. The current treatment provides significant improvement of lipids.    Lab Results  Component Value Date   NA 133 (L) 02/08/2022   K 3.3 (L) 02/08/2022   CO2 24 02/08/2022   GLUCOSE 137 (H) 02/08/2022   BUN 23 (H) 02/08/2022   CREATININE 0.89 02/08/2022   CALCIUM 8.9 02/08/2022   EGFR 87 10/13/2021   GFRNONAA >60 02/08/2022   Lab Results  Component Value Date   CHOL 132 02/08/2022   HDL 41 02/08/2022   LDLCALC 66 02/08/2022   TRIG 124 02/08/2022   CHOLHDL 3.2 02/08/2022   Lab Results  Component Value Date   TSH 2.490 06/24/2016   Lab Results  Component Value Date   HGBA1C 7.2 (A) 06/10/2022   Lab Results  Component Value Date   WBC 8.7 02/08/2022   HGB 16.4 02/08/2022   HCT 45.0 02/08/2022   MCV 85.7 02/08/2022   PLT 242 02/08/2022   Lab Results  Component Value Date   ALT 34 02/08/2022    AST 23 02/08/2022   ALKPHOS 58 02/08/2022   BILITOT 1.1 02/08/2022   No results found for: "25OHVITD2", "25OHVITD3", "VD25OH"   Review of Systems  Constitutional:  Negative for appetite change, fatigue and unexpected weight change.  Eyes:  Negative for visual disturbance.  Respiratory:  Negative for cough, shortness of breath and wheezing.   Cardiovascular:  Negative for chest pain, palpitations and leg swelling.  Gastrointestinal:  Negative for abdominal pain and blood in stool.  Endocrine: Negative for polydipsia and polyuria.  Genitourinary:  Negative for dysuria and hematuria.  Skin:  Negative for color change and rash.  Neurological:  Negative for tremors, numbness and headaches.  Psychiatric/Behavioral:  Negative for dysphoric mood.     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    No Known Allergies  Past Surgical History:  Procedure Laterality Date   COLONOSCOPY WITH PROPOFOL N/A 08/21/2019   Procedure: COLONOSCOPY WITH BIOPSY;  Surgeon: Toney Reil, MD;  Location: [facility];  Service: Endoscopy;  Laterality: N/A;  Requests late as possible   NO PAST SURGERIES     none  POLYPECTOMY N/A 08/21/2019   Procedure: POLYPECTOMY;  Surgeon: Toney Reil, MD;  Location: [facility];  Service: Endoscopy;  Laterality: N/A;    Social History   Tobacco Use   Smoking status: Never   Smokeless tobacco: Never  Vaping Use   Vaping Use: Never used  Substance Use Topics   Alcohol use: Yes    Alcohol/week: 1.0 standard drink of alcohol    Types: 1  Standard drinks or equivalent per week    Comment: couple times/year   Drug use: No     Medication list has been reviewed and updated.  Current Meds  Medication Sig   Accu-Chek Softclix Lancets lancets Use as instructed   aspirin 81 MG EC tablet Take by mouth.   blood glucose meter kit and supplies KIT Dispense based on patient and insurance preference. Use up to four times daily as directed.   Blood Glucose Monitoring Suppl (FREESTYLE LITE) DEVI 1 each by Other route 2 (two) times daily.   Glucose Blood (BLOOD GLUCOSE TEST STRIPS 333) STRP Use up to 4 times daily as directed   tadalafil (CIALIS) 5 MG tablet Take 1 tablet (5 mg total) by mouth daily.   [DISCONTINUED] amLODipine (NORVASC) 10 MG tablet Take 1 tablet (10 mg total) by mouth daily.   [DISCONTINUED] atorvastatin (LIPITOR) 10 MG tablet Take 1 tablet (10 mg total) by mouth daily.   [DISCONTINUED] dapagliflozin propanediol (FARXIGA) 10 MG TABS tablet Take 1 tablet (10 mg total) by mouth daily before breakfast.   [DISCONTINUED] hydrochlorothiazide (HYDRODIURIL) 25 MG tablet Take 1 tablet (25 mg total) by mouth daily.   [DISCONTINUED] metFORMIN (GLUCOPHAGE-XR) 500 MG 24 hr tablet Take 2 tablets (1,000 mg total) by mouth daily with breakfast.       10/11/2022    3:25 PM 06/10/2022    8:38 AM 06/10/2022    8:37 AM 02/08/2022    8:38 AM  GAD 7 : Generalized Anxiety Score  Nervous, Anxious, on Edge 0 0 0 0  Control/stop worrying 0 0 0 0  Worry too much - different things 0 0 0 0  Trouble relaxing 0 0 0 0  Restless 0 0 0 0  Easily annoyed or irritable 0 0 0 0  Afraid - awful might happen 0 0 0 0  Total GAD 7 Score 0 0 0 0  Anxiety Difficulty Not difficult at all Not difficult at all Not difficult at all Not difficult at all       10/11/2022    3:25 PM 06/10/2022    8:38 AM 06/10/2022    8:37 AM  Depression screen PHQ 2/9  Decreased Interest 0 0 0  Down, Depressed, Hopeless 0 0 0  PHQ - 2 Score 0 0 0  Altered sleeping 0  0 0  Tired, decreased energy 0 0 0  Change in appetite 0 0 0  Feeling bad or failure about yourself  0 0 0  Trouble concentrating 0 0 0  Moving slowly or fidgety/restless 0 0 0  Suicidal thoughts 0 0 0  PHQ-9 Score 0 0 0  Difficult doing work/chores Not difficult at all Not difficult at all Not difficult at all    BP Readings from Last 3 Encounters:  10/11/22 118/76  09/28/22 (!) 148/88  06/10/22 132/82    Physical Exam Vitals and nursing note reviewed.  Constitutional:      General: He is not in acute distress.    Appearance: He is well-developed.  HENT:  Head: Normocephalic and atraumatic.  Neck:     Vascular: No carotid bruit.  Cardiovascular:     Rate and Rhythm: Normal rate and regular rhythm.     Pulses: Normal pulses.     Heart sounds: No murmur heard. Pulmonary:     Effort: Pulmonary effort is normal. No respiratory distress.     Breath sounds: No wheezing or rhonchi.  Musculoskeletal:     Cervical back: Normal range of motion.     Right lower leg: No edema.     Left lower leg: No edema.  Lymphadenopathy:     Cervical: No cervical adenopathy.  Skin:    General: Skin is warm and dry.     Capillary Refill: Capillary refill takes less than 2 seconds.     Findings: No rash.  Neurological:     General: No focal deficit present.     Mental Status: He is alert and oriented to person, place, and time.  Psychiatric:        Mood and Affect: Mood normal.        Behavior: Behavior normal.     Wt Readings from Last 3 Encounters:  10/11/22 221 lb (100.2 kg)  09/28/22 219 lb 6.4 oz (99.5 kg)  06/10/22 216 lb (98 kg)    BP 118/76 (BP Location: Right Arm, Cuff Size: Large)   Pulse 67   Ht 5\' 7"  (1.702 m)   Wt 221 lb (100.2 kg) Comment: Steel Toe Shoes  SpO2 98%   BMI 34.61 kg/m   Assessment and Plan:  Problem List Items Addressed This Visit       Cardiovascular and Mediastinum   Essential hypertension - Primary (Chronic)    Stable exam with well  controlled BP.  Currently taking amlodipine and hctz. Consider adding ACE or ARB Tolerating medications without concerns or side effects. Will continue to recommend low sodium diet and current regimen.       Relevant Medications   amLODipine (NORVASC) 10 MG tablet   hydrochlorothiazide (HYDRODIURIL) 25 MG tablet   atorvastatin (LIPITOR) 10 MG tablet     Endocrine   Hyperlipidemia associated with type 2 diabetes mellitus (HCC) (Chronic)    Tolerating statin medications.  No side effects noted. Lab Results  Component Value Date   LDLCALC 66 02/08/2022         Relevant Medications   amLODipine (NORVASC) 10 MG tablet   hydrochlorothiazide (HYDRODIURIL) 25 MG tablet   atorvastatin (LIPITOR) 10 MG tablet   dapagliflozin propanediol (FARXIGA) 10 MG TABS tablet   metFORMIN (GLUCOPHAGE-XR) 500 MG 24 hr tablet   Type II diabetes mellitus with complication (HCC) (Chronic)    Blood sugars stable without hypoglycemic symptoms or events. Currently being treated with Metformin and [place]. Lab Results  Component Value Date   HGBA1C 7.2 (A) 06/10/2022  With a goal of < 8.0.       Relevant Medications   atorvastatin (LIPITOR) 10 MG tablet   dapagliflozin propanediol (FARXIGA) 10 MG TABS tablet   metFORMIN (GLUCOPHAGE-XR) 500 MG 24 hr tablet   Other Relevant Orders   Basic metabolic panel   Hemoglobin A1c   Other Visit Diagnoses     Need for vaccination for pneumococcus       He continues to decline immunizations       Return in about 4 months (around 02/10/2023) for CPX.   Partially dictated using Dragon software, any errors are not intentional.  Reubin Milan, MD [facility]  [facility] [place], [place]

## 2022-10-11 NOTE — Patient Instructions (Signed)
-  It was a pleasure to see you today! Please review your visit summary for helpful information. -Lab results are usually available within 1-2 days and we will call once reviewed. -I would encourage you to follow your care via MyChart where you can access lab results, notes, messages, and more. -If you feel that we did a nice job today, please complete your after-visit survey and leave us a Google review! Your CMA today was Tanveer Brammer and your provider was Dr Laura Berglund, MD.  

## 2022-10-11 NOTE — Assessment & Plan Note (Signed)
Stable exam with well controlled BP.  Currently taking amlodipine and hctz. Consider adding ACE or ARB Tolerating medications without concerns or side effects. Will continue to recommend low sodium diet and current regimen.

## 2022-10-11 NOTE — Assessment & Plan Note (Signed)
Tolerating statin medications.  No side effects noted. Lab Results  Component Value Date   LDLCALC 66 02/08/2022

## 2022-10-11 NOTE — Assessment & Plan Note (Signed)
Blood sugars stable without hypoglycemic symptoms or events. Currently being treated with Metformin and [place]. Lab Results  Component Value Date   HGBA1C 7.2 (A) 06/10/2022   With a goal of < 8.0.

## 2022-10-12 LAB — BASIC METABOLIC PANEL
BUN/Creatinine Ratio: 16 (ref 9–20)
BUN: 16 mg/dL (ref 6–24)
CO2: 22 mmol/L (ref 20–29)
Calcium: 9.5 mg/dL (ref 8.7–10.2)
Chloride: 99 mmol/L (ref 96–106)
Creatinine, Ser: 0.98 mg/dL (ref 0.76–1.27)
Glucose: 147 mg/dL — ABNORMAL HIGH (ref 70–99)
Potassium: 3.6 mmol/L (ref 3.5–5.2)
Sodium: 138 mmol/L (ref 134–144)
eGFR: 92 mL/min/{1.73_m2} (ref >59)

## 2022-10-12 LAB — HEMOGLOBIN A1C
Est. average glucose Bld gHb Est-mCnc: 166 mg/dL
Hgb A1c MFr Bld: 7.4 % — ABNORMAL HIGH (ref 4.8–5.6)

## 2022-11-19 ENCOUNTER — Other Ambulatory Visit: Payer: Self-pay | Admitting: Internal Medicine

## 2022-11-19 DIAGNOSIS — I10 Essential (primary) hypertension: Secondary | ICD-10-CM

## 2022-11-19 NOTE — Telephone Encounter (Signed)
Requested Prescriptions  Refused Prescriptions Disp Refills   amLODipine (NORVASC) 10 MG tablet [Pharmacy Med Name: AMLODIPINE BESYLATE TABS 10MG ] 90 tablet 3    Sig: TAKE 1 TABLET DAILY     Cardiovascular: Calcium Channel Blockers 2 Passed - 11/19/2022  1:06 AM      Passed - Last BP in normal range    BP Readings from Last 1 Encounters:  10/11/22 118/76         Passed - Last Heart Rate in normal range    Pulse Readings from Last 1 Encounters:  10/11/22 67         Passed - Valid encounter within last 6 months    Recent Outpatient Visits           1 month ago Essential hypertension   [place] [facility] at [facility], Nyoka Cowden, MD   5 months ago Essential hypertension   [place] [facility] at [facility], Nyoka Cowden, MD   9 months ago Annual physical exam   [facility] at [facility], Nyoka Cowden, MD   1 year ago Type II diabetes mellitus with complication [facility])   [place] [facility] at [facility], Nyoka Cowden, MD   1 year ago Type II diabetes mellitus with complication [facility])   [place] [facility] at [facility], Nyoka Cowden, MD       Future Appointments             In 3 months Judithann Graves, Nyoka Cowden, MD [facility] at [facility], [facility]   In 10 months Richardo Hanks, Laurette Schimke, MD [facility] [place]

## 2023-02-01 DIAGNOSIS — I10 Essential (primary) hypertension: Secondary | ICD-10-CM | POA: Diagnosis not present

## 2023-02-01 DIAGNOSIS — M545 Low back pain, unspecified: Secondary | ICD-10-CM | POA: Diagnosis not present

## 2023-02-01 DIAGNOSIS — Z79899 Other long term (current) drug therapy: Secondary | ICD-10-CM | POA: Diagnosis not present

## 2023-02-01 DIAGNOSIS — E119 Type 2 diabetes mellitus without complications: Secondary | ICD-10-CM | POA: Diagnosis not present

## 2023-02-01 DIAGNOSIS — S39012A Strain of muscle, fascia and tendon of lower back, initial encounter: Secondary | ICD-10-CM | POA: Diagnosis not present

## 2023-02-01 DIAGNOSIS — X500XXA Overexertion from strenuous movement or load, initial encounter: Secondary | ICD-10-CM | POA: Diagnosis not present

## 2023-02-11 ENCOUNTER — Encounter: Payer: BC Managed Care – PPO | Admitting: Internal Medicine

## 2023-02-22 ENCOUNTER — Other Ambulatory Visit: Payer: Self-pay | Admitting: Internal Medicine

## 2023-02-22 DIAGNOSIS — I10 Essential (primary) hypertension: Secondary | ICD-10-CM

## 2023-02-22 NOTE — Telephone Encounter (Signed)
Medication Refill - Medication:  amLODipine (NORVASC) 10 MG tablet  *completely out   Has the patient contacted their pharmacy? Yes, advised to contact PCP  Preferred Pharmacy (with phone number or street name):   [facility] [address], [place] - [address]  Phone: [phone] Fax: [phone]  Has the patient been seen for an appointment in the last year OR does the patient have an upcoming appointment? Yes. CPE in October with Dr Judithann Graves

## 2023-02-23 MED ORDER — AMLODIPINE BESYLATE 10 MG PO TABS
10.0000 mg | ORAL_TABLET | Freq: Every day | ORAL | 0 refills | Status: DC
Start: 2023-02-23 — End: 2023-02-28

## 2023-02-23 NOTE — Telephone Encounter (Signed)
Requested Prescriptions  Pending Prescriptions Disp Refills   amLODipine (NORVASC) 10 MG tablet 90 tablet 0    Sig: Take 1 tablet (10 mg total) by mouth daily.     Cardiovascular: Calcium Channel Blockers 2 Passed - 02/22/2023 12:19 PM      Passed - Last BP in normal range    BP Readings from Last 1 Encounters:  10/11/22 118/76         Passed - Last Heart Rate in normal range    Pulse Readings from Last 1 Encounters:  10/11/22 67         Passed - Valid encounter within last 6 months    Recent Outpatient Visits           4 months ago Essential hypertension   [place] [facility] at [facility], Nyoka Cowden, MD   8 months ago Essential hypertension   [place] [facility] at [facility], Nyoka Cowden, MD   1 year ago Annual physical exam   [place] [facility] at [facility], Nyoka Cowden, MD   1 year ago Type II diabetes mellitus with complication [facility])   [place] [facility] at [facility], Nyoka Cowden, MD   1 year ago Type II diabetes mellitus with complication [facility])    Primary Care & Sports Medicine at [facility], Nyoka Cowden, MD       Future Appointments             In 5 days Judithann Graves, Nyoka Cowden, MD [facility] Medicine at [facility], [facility]   In 7 months Richardo Hanks, Laurette Schimke, MD [facility] [place]

## 2023-02-28 ENCOUNTER — Encounter: Payer: Self-pay | Admitting: Internal Medicine

## 2023-02-28 ENCOUNTER — Ambulatory Visit (INDEPENDENT_AMBULATORY_CARE_PROVIDER_SITE_OTHER): Payer: BC Managed Care – PPO | Admitting: Internal Medicine

## 2023-02-28 VITALS — BP 122/64 | HR 71 | Ht 67.0 in | Wt 227.0 lb

## 2023-02-28 DIAGNOSIS — E785 Hyperlipidemia, unspecified: Secondary | ICD-10-CM | POA: Diagnosis not present

## 2023-02-28 DIAGNOSIS — R972 Elevated prostate specific antigen [PSA]: Secondary | ICD-10-CM | POA: Diagnosis not present

## 2023-02-28 DIAGNOSIS — R1012 Left upper quadrant pain: Secondary | ICD-10-CM

## 2023-02-28 DIAGNOSIS — E1169 Type 2 diabetes mellitus with other specified complication: Secondary | ICD-10-CM | POA: Diagnosis not present

## 2023-02-28 DIAGNOSIS — E118 Type 2 diabetes mellitus with unspecified complications: Secondary | ICD-10-CM

## 2023-02-28 DIAGNOSIS — I10 Essential (primary) hypertension: Secondary | ICD-10-CM

## 2023-02-28 DIAGNOSIS — Z7984 Long term (current) use of oral hypoglycemic drugs: Secondary | ICD-10-CM

## 2023-02-28 DIAGNOSIS — Z0000 Encounter for general adult medical examination without abnormal findings: Secondary | ICD-10-CM

## 2023-02-28 MED ORDER — AMLODIPINE BESYLATE 10 MG PO TABS
10.0000 mg | ORAL_TABLET | Freq: Every day | ORAL | 1 refills | Status: DC
Start: 2023-02-28 — End: 2023-10-28

## 2023-02-28 MED ORDER — ATORVASTATIN CALCIUM 10 MG PO TABS
10.0000 mg | ORAL_TABLET | Freq: Every day | ORAL | 1 refills | Status: DC
Start: 2023-02-28 — End: 2023-03-02

## 2023-02-28 MED ORDER — HYDROCHLOROTHIAZIDE 25 MG PO TABS
25.0000 mg | ORAL_TABLET | Freq: Every day | ORAL | 1 refills | Status: DC
Start: 2023-02-28 — End: 2023-10-28

## 2023-02-28 MED ORDER — DAPAGLIFLOZIN PROPANEDIOL 10 MG PO TABS
10.0000 mg | ORAL_TABLET | Freq: Every day | ORAL | 1 refills | Status: DC
Start: 2023-02-28 — End: 2023-05-05

## 2023-02-28 NOTE — Assessment & Plan Note (Addendum)
Blood sugars stable without hypoglycemic symptoms or events. Current regimen is metformin and farxiga. Changes made last visit are none. He has some intermittent diarrhea - recommend changing metformin to bid. Lab Results  Component Value Date   HGBA1C 7.4 (H) 10/11/2022  Schedule Eye exam.

## 2023-02-28 NOTE — Assessment & Plan Note (Signed)
LDL is  Lab Results  Component Value Date   LDLCALC 66 02/08/2022   Currently being treated with atorvastatin with good compliance and no concerns.

## 2023-02-28 NOTE — Progress Notes (Signed)
Date:  02/28/2023   Name:  William [name]   DOB:  12-Apr-1971   MRN:  [id number]   Chief Complaint: Annual Exam William [name] is a 52 y.o. male who presents today for his Complete Annual Exam. He feels well. He reports exercising - a little. He reports he is sleeping well.   Colonoscopy: 08/2019 repeat 7 yrs  Immunization History  Administered Date(s) Administered   Hepatitis B 11/18/2014   Tdap 06/28/2013   Health Maintenance Due  Topic Date Due   COVID-19 Vaccine (1) Never done   Zoster Vaccines- Shingrix (1 of 2) Never done   OPHTHALMOLOGY EXAM  11/12/2022   Diabetic kidney evaluation - Urine ACR  02/09/2023    Lab Results  Component Value Date   PSA1 5.1 (H) 09/28/2022   PSA1 5.0 (H) 02/17/2021   PSA1 4.7 (H) 02/04/2021   PSA 4.11 09/25/2021     Diabetes He presents for his follow-up diabetic visit. He has type 2 diabetes mellitus. His disease course has been stable. Pertinent negatives for hypoglycemia include no dizziness, headaches or nervousness/anxiousness. Pertinent negatives for diabetes include no chest pain and no fatigue. Current diabetic treatment includes oral agent (dual therapy). An ACE inhibitor/angiotensin II receptor blocker is not being taken. Eye exam is not current.  Hypertension This is a chronic problem. The problem is controlled. Pertinent negatives include no chest pain, headaches, palpitations or shortness of breath. Past treatments include calcium channel blockers and diuretics. The current treatment provides significant improvement.  Hyperlipidemia This is a chronic problem. Pertinent negatives include no chest pain, myalgias or shortness of breath. Current antihyperlipidemic treatment includes statins.    Lab Results  Component Value Date   NA 138 10/11/2022   K 3.6 10/11/2022   CO2 22 10/11/2022   GLUCOSE 147 (H) 10/11/2022   BUN 16 10/11/2022   CREATININE 0.98 10/11/2022   CALCIUM 9.5 10/11/2022   EGFR 92 10/11/2022   GFRNONAA  >60 02/08/2022   Lab Results  Component Value Date   CHOL 132 02/08/2022   HDL 41 02/08/2022   LDLCALC 66 02/08/2022   TRIG 124 02/08/2022   CHOLHDL 3.2 02/08/2022   Lab Results  Component Value Date   TSH 2.490 06/24/2016   Lab Results  Component Value Date   HGBA1C 7.4 (H) 10/11/2022   Lab Results  Component Value Date   WBC 8.7 02/08/2022   HGB 16.4 02/08/2022   HCT 45.0 02/08/2022   MCV 85.7 02/08/2022   PLT 242 02/08/2022   Lab Results  Component Value Date   ALT 34 02/08/2022   AST 23 02/08/2022   ALKPHOS 58 02/08/2022   BILITOT 1.1 02/08/2022   No results found for: "25OHVITD2", "25OHVITD3", "VD25OH"   Review of Systems  Constitutional:  Negative for appetite change, chills, diaphoresis, fatigue and unexpected weight change.  HENT:  Negative for hearing loss, tinnitus, trouble swallowing and voice change.   Eyes:  Negative for visual disturbance.  Respiratory:  Negative for choking, shortness of breath and wheezing.   Cardiovascular:  Negative for chest pain, palpitations and leg swelling.  Gastrointestinal:  Positive for abdominal pain and diarrhea. Negative for blood in stool and constipation.  Genitourinary:  Negative for difficulty urinating, dysuria and frequency.  Musculoskeletal:  Negative for arthralgias, back pain and myalgias.  Skin:  Negative for color change and rash.  Neurological:  Negative for dizziness, syncope and headaches.  Hematological:  Negative for adenopathy.  Psychiatric/Behavioral:  Negative for dysphoric mood  and sleep disturbance. The patient is not nervous/anxious.     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    No Known Allergies  Past Surgical History:  Procedure Laterality Date   COLONOSCOPY WITH PROPOFOL N/A 08/21/2019   Procedure: COLONOSCOPY WITH BIOPSY;  Surgeon: Toney Reil, MD;  Location: [facility];  Service: Endoscopy;  Laterality: N/A;  Requests late as possible   NO PAST SURGERIES     none     POLYPECTOMY N/A 08/21/2019   Procedure: POLYPECTOMY;  Surgeon: Toney Reil, MD;  Location: [facility];  Service: Endoscopy;  Laterality: N/A;    Social History   Tobacco Use   Smoking status: Never   Smokeless tobacco: Never  Vaping Use   Vaping status: Never Used  Substance Use Topics   Alcohol use: Yes    Alcohol/week: 1.0 standard drink of alcohol    Types: 1 Standard drinks or equivalent per week    Comment: couple times/year   Drug use: No     Medication list has been reviewed and updated.  Current Meds  Medication Sig   Accu-Chek Softclix Lancets lancets Use as instructed   aspirin 81 MG EC tablet Take by mouth.   blood glucose meter kit and supplies KIT Dispense based on patient and insurance preference. Use up to four times daily as directed.   Blood Glucose Monitoring Suppl (FREESTYLE LITE) DEVI 1 each by Other route 2 (two) times daily.   Glucose Blood (BLOOD GLUCOSE TEST STRIPS 333) STRP Use up to 4 times daily as directed   metFORMIN (GLUCOPHAGE-XR) 500 MG 24 hr tablet Take 2 tablets (1,000 mg total) by mouth daily with breakfast.   tadalafil (CIALIS) 5 MG tablet Take 1 tablet (5 mg total) by mouth daily.   [DISCONTINUED] amLODipine (NORVASC) 10 MG tablet Take 1 tablet (10 mg total) by mouth daily.   [DISCONTINUED] atorvastatin (LIPITOR) 10 MG tablet Take 1 tablet (10 mg total) by mouth daily.   [DISCONTINUED] dapagliflozin propanediol (FARXIGA) 10 MG TABS tablet Take 1 tablet (10 mg total) by mouth daily before breakfast.   [DISCONTINUED]  hydrochlorothiazide (HYDRODIURIL) 25 MG tablet Take 1 tablet (25 mg total) by mouth daily.       02/28/2023    8:11 AM 10/11/2022    3:25 PM 06/10/2022    8:38 AM 06/10/2022    8:37 AM  GAD 7 : Generalized Anxiety Score  Nervous, Anxious, on Edge 0 0 0 0  Control/stop worrying 0 0 0 0  Worry too much - different things 0 0 0 0  Trouble relaxing 0 0 0 0  Restless 0 0 0 0  Easily annoyed or irritable 0 0 0 0  Afraid - awful might happen 0 0 0 0  Total GAD 7 Score 0 0 0 0  Anxiety Difficulty Not difficult at all Not difficult at all Not difficult at all Not difficult at all       02/28/2023    8:11 AM 10/11/2022    3:25 PM 06/10/2022    8:38 AM  Depression screen PHQ 2/9  Decreased  Interest 0 0 0  Down, Depressed, Hopeless 0 0 0  PHQ - 2 Score 0 0 0  Altered sleeping 0 0 0  Tired, decreased energy 0 0 0  Change in appetite 0 0 0  Feeling bad or failure about yourself  0 0 0  Trouble concentrating 0 0 0  Moving slowly or fidgety/restless 0 0 0  Suicidal thoughts 0 0 0  PHQ-9 Score 0 0 0  Difficult doing work/chores Not difficult at all Not difficult at all Not difficult at all    BP Readings from Last 3 Encounters:  02/28/23 122/64  10/11/22 118/76  09/28/22 (!) 148/88    Physical Exam Vitals and nursing note reviewed.  Constitutional:      Appearance: Normal appearance. He is well-developed.  HENT:     Head: Normocephalic.     Right Ear: Tympanic membrane, ear canal and external ear normal.     Left Ear: Tympanic membrane, ear canal and external ear normal.     Nose: Nose normal.  Eyes:     Conjunctiva/sclera: Conjunctivae normal.     Pupils: Pupils are equal, round, and reactive to light.  Neck:     Thyroid: No thyromegaly.     Vascular: No carotid bruit.  Cardiovascular:     Rate and Rhythm: Normal rate and regular rhythm.     Heart sounds: Normal heart sounds.  Pulmonary:     Effort: Pulmonary effort is normal.     Breath sounds: Normal breath sounds. No  wheezing.  Chest:  Breasts:    Right: No mass.     Left: No mass.  Abdominal:     General: Bowel sounds are normal.     Palpations: Abdomen is soft.     Tenderness: There is no abdominal tenderness.       Comments: Mild tenderness to deep palpation No muscular tenderness, no mass  Musculoskeletal:        General: Normal range of motion.     Cervical back: Normal range of motion and neck supple.  Lymphadenopathy:     Cervical: No cervical adenopathy.  Skin:    General: Skin is warm and dry.  Neurological:     Mental Status: He is alert and oriented to person, place, and time.     Deep Tendon Reflexes: Reflexes are normal and symmetric.  Psychiatric:        Attention and Perception: Attention normal.        Mood and Affect: Mood normal.        Thought Content: Thought content normal.    Diabetic Foot Exam - Simple   Simple Foot Form Diabetic Foot exam was performed with the following findings: Yes 02/28/2023  8:39 AM  Visual Inspection No deformities, no ulcerations, no other skin breakdown bilaterally: Yes Sensation Testing Intact to touch and monofilament testing bilaterally: Yes Pulse Check Posterior Tibialis and Dorsalis pulse intact bilaterally: Yes Comments      Wt Readings from Last 3 Encounters:  02/28/23 227 lb (103 kg)  10/11/22 221 lb (100.2 kg)  09/28/22 219 lb 6.4 oz (99.5 kg)    BP 122/64   Pulse 71   Ht 5\' 7"  (1.702 m)   Wt 227 lb (103 kg)   SpO2 97%   BMI 35.55 kg/m   Assessment and Plan:  Problem List Items Addressed This Visit       Unprioritized   Type II diabetes mellitus with complication (HCC) (Chronic)    Blood sugars stable  without hypoglycemic symptoms or events. Current regimen is metformin and farxiga. Changes made last visit are none. He has some intermittent diarrhea - recommend changing metformin to bid. Lab Results  Component Value Date   HGBA1C 7.4 (H) 10/11/2022  Schedule Eye exam.       Relevant Medications    atorvastatin (LIPITOR) 10 MG tablet   dapagliflozin propanediol (FARXIGA) 10 MG TABS tablet   Other Relevant Orders   Comprehensive metabolic panel   Hemoglobin A1c   Microalbumin / creatinine urine ratio   Hyperlipidemia associated with type 2 diabetes mellitus (HCC) (Chronic)    LDL is  Lab Results  Component Value Date   LDLCALC 66 02/08/2022   Currently being treated with atorvastatin with good compliance and no concerns.       Relevant Medications   amLODipine (NORVASC) 10 MG tablet   atorvastatin (LIPITOR) 10 MG tablet   hydrochlorothiazide (HYDRODIURIL) 25 MG tablet   dapagliflozin propanediol (FARXIGA) 10 MG TABS tablet   Other Relevant Orders   Lipid panel   Essential hypertension (Chronic)    Normal exam with stable BP on amlodipine and hctz. No concerns or side effects to current medication. No change in regimen; continue low sodium diet.       Relevant Medications   amLODipine (NORVASC) 10 MG tablet   atorvastatin (LIPITOR) 10 MG tablet   hydrochlorothiazide (HYDRODIURIL) 25 MG tablet   Other Relevant Orders   CBC with Differential/Platelet   Comprehensive metabolic panel   Elevated PSA (Chronic)    Followed by Urology Levels remain < 6 He is on daily cialis with good control of symptoms      Other Visit Diagnoses     Annual physical exam    -  Primary   he declines flu vaccine   Relevant Orders   CBC with Differential/Platelet   Comprehensive metabolic panel   Hemoglobin A1c   Lipid panel   Long term current use of oral hypoglycemic drug       Left upper quadrant abdominal pain       along with loose stools, may be due to metformin will check labs, H pylori   Relevant Orders   H. pylori breath test       Return in about 4 months (around 06/30/2023) for DM.    Reubin Milan, MD [facility] [place]

## 2023-02-28 NOTE — Assessment & Plan Note (Signed)
Followed by Urology Levels remain < 6 He is on daily cialis with good control of symptoms

## 2023-02-28 NOTE — Assessment & Plan Note (Signed)
Normal exam with stable BP on amlodipine and hctz. No concerns or side effects to current medication. No change in regimen; continue low sodium diet.

## 2023-02-28 NOTE — Patient Instructions (Signed)
Schedule Eye exam

## 2023-03-01 LAB — MICROALBUMIN / CREATININE URINE RATIO
Creatinine, Urine: 27.9 mg/dL
Microalb/Creat Ratio: <11 mg/g{creat} (ref 0–29)
Microalbumin, Urine: <3 ug/mL

## 2023-03-02 ENCOUNTER — Other Ambulatory Visit: Payer: Self-pay | Admitting: Internal Medicine

## 2023-03-02 DIAGNOSIS — E1169 Type 2 diabetes mellitus with other specified complication: Secondary | ICD-10-CM

## 2023-03-02 MED ORDER — ATORVASTATIN CALCIUM 20 MG PO TABS
10.0000 mg | ORAL_TABLET | Freq: Every day | ORAL | 1 refills | Status: DC
Start: 2023-03-02 — End: 2023-06-30

## 2023-03-02 MED ORDER — GLIMEPIRIDE 2 MG PO TABS: 2.0000 mg | ORAL_TABLET | Freq: Every day | ORAL | 1 refills | Status: DC

## 2023-03-02 NOTE — Progress Notes (Signed)
Please review.  KP

## 2023-03-08 ENCOUNTER — Encounter: Payer: BC Managed Care – PPO | Admitting: Internal Medicine

## 2023-03-17 ENCOUNTER — Encounter: Payer: BC Managed Care – PPO | Admitting: Internal Medicine

## 2023-04-24 ENCOUNTER — Other Ambulatory Visit: Payer: Self-pay | Admitting: Internal Medicine

## 2023-04-25 NOTE — Telephone Encounter (Signed)
Requested Prescriptions  Pending Prescriptions Disp Refills   Accu-Chek Softclix Lancets lancets [Pharmacy Med Name: SOFTCLIX LANCETS    MIS] 100 each 11    Sig: USE AS DIRECTED     Endocrinology: Diabetes - Testing Supplies Passed - 04/24/2023  7:52 AM      Passed - Valid encounter within last 12 months    Recent Outpatient Visits           1 month ago Annual physical exam   [place] [facility] at [facility], Nyoka Cowden, MD   6 months ago Essential hypertension   [place] [facility] Care [facility] Medicine at [facility], Nyoka Cowden, MD   10 months ago Essential hypertension   [place] [facility] Medicine at [facility], Nyoka Cowden, MD   1 year ago Annual physical exam   [facility] Medicine at [facility], Nyoka Cowden, MD   1 year ago Type II diabetes mellitus with complication [facility])   [place] [facility] Medicine at [facility], Nyoka Cowden, MD       Future Appointments             In 2 months Judithann Graves, Nyoka Cowden, MD [facility] Primary Care & Sports Medicine at [facility], [facility]   In 5 months Richardo Hanks, Laurette Schimke, MD [facility] [place]   In 10 months Reubin Milan, MD [facility] Primary Care & Sports Medicine at [facility], [facility]

## 2023-05-04 ENCOUNTER — Other Ambulatory Visit: Payer: Self-pay | Admitting: Internal Medicine

## 2023-05-04 DIAGNOSIS — E118 Type 2 diabetes mellitus with unspecified complications: Secondary | ICD-10-CM

## 2023-05-05 NOTE — Telephone Encounter (Signed)
Requested medication (s) are due for refill today: yes  Requested medication (s) are on the active medication list: yes  Last refill:  10/11/22 #180 1 RF  Future visit scheduled: yes  Notes to clinic:  Lab work outside normal limits   Requested Prescriptions  Pending Prescriptions Disp Refills   metFORMIN (GLUCOPHAGE-XR) 500 MG 24 hr tablet [Pharmacy Med Name: METFORMIN HCL ER TABS 500MG ] 180 tablet 3    Sig: TAKE 2 TABLETS DAILY WITH BREAKFAST     Endocrinology:  Diabetes - Biguanides Failed - 05/04/2023  1:22 AM      Failed - HBA1C is between 0 and 7.9 and within 180 days    Hgb A1c MFr Bld  Date Value Ref Range Status  02/28/2023 8.9 (H) 4.8 - 5.6 % Final    Comment:             Prediabetes: 5.7 - 6.4          Diabetes: >6.4          Glycemic control for adults with diabetes: <7.0          Failed - B12 Level in normal range and within 720 days    No results found for: "VITAMINB12"       Passed - Cr in normal range and within 360 days    Creatinine  Date Value Ref Range Status  07/31/2012 1.03 0.60 - 1.30 mg/dL Final   Creatinine, Ser  Date Value Ref Range Status  02/28/2023 0.93 0.76 - 1.27 mg/dL Final         Passed - eGFR in normal range and within 360 days    EGFR (African American)  Date Value Ref Range Status  07/31/2012 >60  Final   GFR calc Af Amer  Date Value Ref Range Status  02/20/2020 103 >59 mL/min/1.73 Final    Comment:    **[facility] currently reports eGFR in compliance with the current**   recommendations of the [employer]. [facility] will   update reporting as new guidelines are published from the NKF-ASN   Task force.    EGFR (Non-African Amer.)  Date Value Ref Range Status  07/31/2012 >60  Final    Comment:    eGFR values <21mL/min/1.73 m2 may be an indication of chronic kidney disease (CKD). Calculated eGFR is useful in patients with stable renal function. The eGFR calculation will not be reliable in acutely ill  patients when serum creatinine is changing rapidly. It is not useful in  patients on dialysis. The eGFR calculation may not be applicable to patients at the low and high extremes of body sizes, pregnant women, and vegetarians.    GFR, Estimated  Date Value Ref Range Status  02/08/2022 >60 >60 mL/min Final    Comment:    (NOTE) Calculated using the CKD-EPI Creatinine Equation (2021)    eGFR  Date Value Ref Range Status  02/28/2023 98 >59 mL/min/1.73 Final         Passed - Valid encounter within last 6 months    Recent Outpatient Visits           2 months ago Annual physical exam   [place] [facility] at [facility], Nyoka Cowden, MD   6 months ago Essential hypertension    Primary Care & Sports Medicine at [facility], Nyoka Cowden, MD   10 months ago Essential hypertension   [facility] Care [facility] Medicine at [facility], Vernona Rieger  H, MD   1 year ago Annual physical exam   [place] [facility] Medicine at [facility], Nyoka Cowden, MD   1 year ago Type II diabetes mellitus with complication [facility]   [place] Primary Care & Sports Medicine at [facility], Nyoka Cowden, MD       Future Appointments             In 1 month Judithann Graves Nyoka Cowden, MD [facility] Primary Care & Sports Medicine at [facility], [facility]   In 5 months Sondra Come, MD [facility] [place]   In 10 months Reubin Milan, MD [facility] Primary Care & Sports Medicine at [facility], [facility]            Passed - CBC within normal limits and completed in the last 12 months    WBC  Date Value Ref Range Status  02/28/2023 7.8 3.4 - 10.8 x10E3/uL Final  02/08/2022 8.7 4.0 - 10.5 K/uL Final   RBC  Date Value Ref Range Status  02/28/2023 5.40 4.14 - 5.80 x10E6/uL Final  02/08/2022 5.25 4.22 - 5.81 MIL/uL Final   Hemoglobin  Date Value Ref Range Status  02/28/2023 16.7 13.0 - 17.7  g/dL Final   Hematocrit  Date Value Ref Range Status  02/28/2023 48.9 37.5 - 51.0 % Final   MCHC  Date Value Ref Range Status  02/28/2023 34.2 31.5 - 35.7 g/dL Final  25/95/6387 56.4 (H) 30.0 - 36.0 g/dL Final   [facility]  Date Value Ref Range Status  02/28/2023 30.9 26.6 - 33.0 pg Final  02/08/2022 31.2 26.0 - 34.0 pg Final   MCV  Date Value Ref Range Status  02/28/2023 91 79 - 97 fL Final  07/31/2012 88 80 - 100 fL Final   No results found for: "PLTCOUNTKUC", "LABPLAT", "POCPLA" RDW  Date Value Ref Range Status  02/28/2023 12.6 11.6 - 15.4 % Final  07/31/2012 12.3 11.5 - 14.5 % Final          FARXIGA 10 MG TABS tablet [Pharmacy Med Name: FARXIGA TABS 10MG ] 90 tablet 3    Sig: TAKE 1 TABLET DAILY BEFORE BREAKFAST     Endocrinology:  Diabetes - SGLT2 Inhibitors Failed - 05/04/2023  1:22 AM      Failed - HBA1C is between 0 and 7.9 and within 180 days    Hgb A1c MFr Bld  Date Value Ref Range Status  02/28/2023 8.9 (H) 4.8 - 5.6 % Final    Comment:             Prediabetes: 5.7 - 6.4          Diabetes: >6.4          Glycemic control for adults with diabetes: <7.0          Passed - Cr in normal range and within 360 days    Creatinine  Date Value Ref Range Status  07/31/2012 1.03 0.60 - 1.30 mg/dL Final   Creatinine, Ser  Date Value Ref Range Status  02/28/2023 0.93 0.76 - 1.27 mg/dL Final         Passed - eGFR in normal range and within 360 days    EGFR (African American)  Date Value Ref Range Status  07/31/2012 >60  Final   GFR calc Af Amer  Date Value Ref Range Status  02/20/2020 103 >59 mL/min/1.73 Final    Comment:    **[facility] currently reports eGFR in compliance with  the current**   recommendations of the [employer]. [facility] will   update reporting as new guidelines are published from the NKF-ASN   Task force.    EGFR (Non-African Amer.)  Date Value Ref Range Status  07/31/2012 >60  Final    Comment:    eGFR values <22mL/min/1.73  m2 may be an indication of chronic kidney disease (CKD). Calculated eGFR is useful in patients with stable renal function. The eGFR calculation will not be reliable in acutely ill patients when serum creatinine is changing rapidly. It is not useful in  patients on dialysis. The eGFR calculation may not be applicable to patients at the low and high extremes of body sizes, pregnant women, and vegetarians.    GFR, Estimated  Date Value Ref Range Status  02/08/2022 >60 >60 mL/min Final    Comment:    (NOTE) Calculated using the CKD-EPI Creatinine Equation (2021)    eGFR  Date Value Ref Range Status  02/28/2023 98 >59 mL/min/1.73 Final         Passed - Valid encounter within last 6 months    Recent Outpatient Visits           2 months ago Annual physical exam   [place] [facility] at [facility], Nyoka Cowden, MD   6 months ago Essential hypertension   [place] [facility] Care & Sports Medicine at [facility], Nyoka Cowden, MD   10 months ago Essential hypertension   [place] [facility] Care & [facility] Medicine at [facility], Nyoka Cowden, MD   1 year ago Annual physical exam   [facility] Primary Care & Sports Medicine at [facility], Nyoka Cowden, MD   1 year ago Type II diabetes mellitus with complication [facility])   [place] Primary Care & Sports Medicine at [facility], Nyoka Cowden, MD       Future Appointments             In 1 month Judithann Graves, Nyoka Cowden, MD [facility] Medicine at [facility], [facility]   In 5 months Richardo Hanks, Laurette Schimke, MD [facility] [place]   In 10 months Reubin Milan, MD [facility] Primary Care & Sports Medicine at [facility], [facility]

## 2023-06-30 ENCOUNTER — Encounter: Payer: Self-pay | Admitting: Internal Medicine

## 2023-06-30 ENCOUNTER — Ambulatory Visit (INDEPENDENT_AMBULATORY_CARE_PROVIDER_SITE_OTHER): Payer: BC Managed Care – PPO | Admitting: Internal Medicine

## 2023-06-30 VITALS — BP 122/68 | HR 64 | Ht 67.0 in | Wt 219.0 lb

## 2023-06-30 DIAGNOSIS — E785 Hyperlipidemia, unspecified: Secondary | ICD-10-CM

## 2023-06-30 DIAGNOSIS — Z7984 Long term (current) use of oral hypoglycemic drugs: Secondary | ICD-10-CM | POA: Diagnosis not present

## 2023-06-30 DIAGNOSIS — E1169 Type 2 diabetes mellitus with other specified complication: Secondary | ICD-10-CM | POA: Diagnosis not present

## 2023-06-30 DIAGNOSIS — E118 Type 2 diabetes mellitus with unspecified complications: Secondary | ICD-10-CM | POA: Diagnosis not present

## 2023-06-30 DIAGNOSIS — I10 Essential (primary) hypertension: Secondary | ICD-10-CM | POA: Diagnosis not present

## 2023-06-30 MED ORDER — ATORVASTATIN CALCIUM 20 MG PO TABS: 20.0000 mg | ORAL_TABLET | Freq: Every day | ORAL | 1 refills | Status: DC

## 2023-06-30 MED ORDER — GLIMEPIRIDE 2 MG PO TABS: 2.0000 mg | ORAL_TABLET | Freq: Every day | ORAL | 1 refills | Status: DC

## 2023-06-30 NOTE — Assessment & Plan Note (Signed)
Normal exam with stable BP on amlodipine and hctz. No concerns or side effects to current medication. No change in regimen; continue low sodium diet.

## 2023-06-30 NOTE — Assessment & Plan Note (Addendum)
Blood sugars stable without hypoglycemic symptoms or events. Currently managed with MTF, Marcelline Deist and glipizide. Changes made last visit are adding glipizide and reducing dose of MTF due to abd pain. He has made significant diet changes and has lost 8 lbs.  His BS range from 80-140. Lab Results  Component Value Date   HGBA1C 8.9 (H) 02/28/2023

## 2023-06-30 NOTE — Assessment & Plan Note (Signed)
LDL is  Lab Results  Component Value Date   LDLCALC 102 (H) 02/28/2023   Current regimen is atorvastatin 20 mg (dose increased last visit).  Tolerating medications well without issues.

## 2023-06-30 NOTE — Progress Notes (Signed)
Date:  06/30/2023   Name:  William [name]   DOB:  [birth year]   MRN:  [id number]   Chief Complaint: Hypertension and Diabetes  Hypertension This is a chronic problem. The problem is controlled. Pertinent negatives include no chest pain, headaches, palpitations or shortness of breath.  Diabetes He presents for his follow-up diabetic visit. He has type 2 diabetes mellitus. Pertinent negatives for hypoglycemia include no headaches or tremors. Pertinent negatives for diabetes include no chest pain, no fatigue, no polydipsia and no polyuria.  Hyperlipidemia This is a chronic problem. Pertinent negatives include no chest pain or shortness of breath. Current antihyperlipidemic treatment includes statins. The current treatment provides significant improvement of lipids.    Review of Systems  Constitutional:  Negative for appetite change, fatigue and unexpected weight change.  Eyes:  Negative for visual disturbance.  Respiratory:  Negative for cough, shortness of breath and wheezing.   Cardiovascular:  Negative for chest pain, palpitations and leg swelling.  Gastrointestinal:  Negative for abdominal pain and blood in stool.  Endocrine: Negative for polydipsia and polyuria.  Genitourinary:  Negative for dysuria and hematuria.  Skin:  Negative for color change and rash.  Neurological:  Negative for tremors, numbness and headaches.  Psychiatric/Behavioral:  Negative for dysphoric mood.      Lab Results  Component Value Date   NA 138 02/28/2023   K 4.2 02/28/2023   CO2 21 02/28/2023   GLUCOSE 188 (H) 02/28/2023   BUN 9 02/28/2023   CREATININE 0.93 02/28/2023   CALCIUM 9.6 02/28/2023   EGFR 98 02/28/2023   GFRNONAA >60 02/08/2022   Lab Results  Component Value Date   CHOL 175 02/28/2023   HDL 38 (L) 02/28/2023   LDLCALC 102 (H) 02/28/2023   TRIG 204 (H) 02/28/2023   CHOLHDL 4.6 02/28/2023   Lab Results  Component Value Date   TSH 2.490 06/24/2016   Lab Results  Component  Value Date   HGBA1C 8.9 (H) 02/28/2023   Lab Results  Component Value Date   WBC 7.8 02/28/2023   HGB 16.7 02/28/2023   HCT 48.9 02/28/2023   MCV 91 02/28/2023   PLT 223 02/28/2023   Lab Results  Component Value Date   ALT 44 02/28/2023   AST 32 02/28/2023   ALKPHOS 63 02/28/2023   BILITOT 0.8 02/28/2023   No results found for: "25OHVITD2", "25OHVITD3", "VD25OH"   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    No Known Allergies  Past Surgical History:  Procedure Laterality Date   COLONOSCOPY WITH PROPOFOL N/A 08/21/2019   Procedure: COLONOSCOPY WITH BIOPSY;  Surgeon: Toney Reil, MD;  Location: [facility];  Service: Endoscopy;  Laterality: N/A;  Requests late as possible   NO PAST SURGERIES     none     POLYPECTOMY N/A 08/21/2019   Procedure: POLYPECTOMY;  Surgeon: Toney Reil, MD;  Location: [facility];  Service: Endoscopy;  Laterality: N/A;    Social History   Tobacco Use   Smoking status: Never   Smokeless tobacco: Never  Vaping Use   Vaping status: Never Used  Substance Use Topics   Alcohol use: Yes    Alcohol/week: 1.0 standard drink of alcohol    Types: 1 Standard drinks or equivalent per week    Comment: couple times/year   Drug use: No     Medication list has been reviewed and updated.  Current Meds  Medication Sig   Accu-Chek Softclix Lancets lancets USE AS DIRECTED   amLODipine (NORVASC) 10 MG tablet Take 1 tablet (10 mg total) by mouth daily.   aspirin 81 MG EC tablet Take by mouth.   blood glucose meter kit and  supplies KIT Dispense based on patient and insurance preference. Use up to four times daily as directed.   Blood Glucose Monitoring Suppl (FREESTYLE LITE) DEVI 1 each by Other route 2 (two) times daily.   dapagliflozin propanediol (FARXIGA) 10 MG TABS tablet TAKE 1 TABLET DAILY BEFORE BREAKFAST   Glucose Blood (BLOOD GLUCOSE TEST STRIPS 333) STRP Use up to 4 times daily as directed   hydrochlorothiazide (HYDRODIURIL) 25 MG tablet Take 1 tablet (25 mg total) by mouth daily.   metFORMIN (GLUCOPHAGE-XR) 500 MG 24 hr tablet TAKE 2 TABLETS DAILY WITH BREAKFAST   tadalafil (CIALIS) 5 MG tablet Take 1 tablet (5 mg total) by mouth daily.   [DISCONTINUED] atorvastatin (LIPITOR) 20 MG tablet Take 0.5 tablets (10 mg total) by mouth daily.   [DISCONTINUED] glimepiride (AMARYL) 2 MG tablet Take 1 tablet (2 mg total) by mouth daily before breakfast.       06/30/2023    8:09 AM 02/28/2023    8:11 AM 10/11/2022    3:25 PM 06/10/2022    8:38 AM  GAD 7 : Generalized Anxiety Score  Nervous, Anxious, on Edge 0 0 0 0  Control/stop worrying 0 0 0 0  Worry too much - different things 0 0 0 0  Trouble relaxing 0 0 0 0  Restless 0 0 0 0  Easily annoyed or irritable 0 0 0 0  Afraid - awful might happen 0 0 0 0  Total GAD 7 Score 0 0 0 0  Anxiety Difficulty Not difficult at all Not difficult at all Not difficult at all Not difficult at all       06/30/2023    8:09 AM 02/28/2023    8:11 AM 10/11/2022    3:25 PM  Depression screen PHQ 2/9  Decreased Interest 0 0 0  Down, Depressed, Hopeless 0 0 0  PHQ - 2 Score 0 0 0  Altered sleeping 0 0 0  Tired, decreased energy 0 0 0  Change in appetite 0 0 0  Feeling bad or failure about yourself  0 0 0  Trouble concentrating 0 0 0  Moving slowly or fidgety/restless 0 0 0  Suicidal thoughts 0 0 0  PHQ-9 Score 0 0 0  Difficult doing work/chores Not difficult at all Not difficult at all Not difficult at all    BP Readings from Last 3 Encounters:  06/30/23 122/68   02/28/23 122/64  10/11/22 118/76    Physical Exam Vitals and nursing note reviewed.  Constitutional:      General: He is not in acute distress.    Appearance: He is well-developed.  HENT:     Head: Normocephalic and atraumatic.     Nose:     Right Sinus: No maxillary sinus tenderness.     Left Sinus: No maxillary sinus tenderness.  Cardiovascular:     Rate and Rhythm: Normal rate and regular rhythm.  Pulmonary:  Effort: Pulmonary effort is normal. No respiratory distress.     Breath sounds: Normal breath sounds. No decreased breath sounds, wheezing or rhonchi.  Musculoskeletal:     Right lower leg: No edema.     Left lower leg: No edema.  Lymphadenopathy:     Cervical: No cervical adenopathy.  Skin:    General: Skin is warm and dry.     Findings: No rash.  Neurological:     Mental Status: He is alert and oriented to person, place, and time.  Psychiatric:        Mood and Affect: Mood normal.        Behavior: Behavior normal.     Wt Readings from Last 3 Encounters:  06/30/23 219 lb (99.3 kg)  02/28/23 227 lb (103 kg)  10/11/22 221 lb (100.2 kg)    BP 122/68   Pulse 64   Ht 5\' 7"  (1.702 m)   Wt 219 lb (99.3 kg)   SpO2 98%   BMI 34.30 kg/m   Assessment and Plan:  Problem List Items Addressed This Visit       Unprioritized   Essential hypertension (Chronic)   Normal exam with stable BP on amlodipine and hctz. No concerns or side effects to current medication. No change in regimen; continue low sodium diet.       Relevant Medications   atorvastatin (LIPITOR) 20 MG tablet   Hyperlipidemia associated with type 2 diabetes mellitus (HCC) (Chronic)   LDL is  Lab Results  Component Value Date   LDLCALC 102 (H) 02/28/2023   Current regimen is atorvastatin 20 mg (dose increased last visit).  Tolerating medications well without issues.       Relevant Medications   atorvastatin (LIPITOR) 20 MG tablet   glimepiride (AMARYL) 2 MG tablet   Other Relevant  Orders   Lipid panel   Type II diabetes mellitus with complication (HCC) - Primary (Chronic)   Blood sugars stable without hypoglycemic symptoms or events. Currently managed with MTF, Marcelline Deist and glipizide. Changes made last visit are adding glipizide and reducing dose of MTF due to abd pain. He has made significant diet changes and has lost 8 lbs.  His BS range from 80-140. Lab Results  Component Value Date   HGBA1C 8.9 (H) 02/28/2023         Relevant Medications   atorvastatin (LIPITOR) 20 MG tablet   glimepiride (AMARYL) 2 MG tablet   Other Relevant Orders   Comprehensive metabolic panel   Hemoglobin A1c   Other Visit Diagnoses       Long term current use of oral hypoglycemic drug           Return in about 4 months (around 10/28/2023) for DM, HTN.    Reubin Milan, MD [facility] Primary Care and [facility] Medicine [place]

## 2023-07-01 ENCOUNTER — Encounter: Payer: Self-pay | Admitting: Internal Medicine

## 2023-07-01 LAB — LIPID PANEL
Chol/HDL Ratio: 4.5 {ratio} (ref 0.0–5.0)
Cholesterol, Total: 131 mg/dL (ref 100–199)
HDL: 29 mg/dL — ABNORMAL LOW (ref >39)
LDL Chol Calc (NIH): 82 mg/dL (ref 0–99)
Triglycerides: 110 mg/dL (ref 0–149)
VLDL Cholesterol Cal: 20 mg/dL (ref 5–40)

## 2023-07-01 LAB — COMPREHENSIVE METABOLIC PANEL
ALT: 29 [IU]/L (ref 0–44)
AST: 22 [IU]/L (ref 0–40)
Albumin: 4.4 g/dL (ref 3.8–4.9)
Alkaline Phosphatase: 73 [IU]/L (ref 44–121)
BUN/Creatinine Ratio: 16 (ref 9–20)
BUN: 15 mg/dL (ref 6–24)
Bilirubin Total: 0.8 mg/dL (ref 0.0–1.2)
CO2: 19 mmol/L — ABNORMAL LOW (ref 20–29)
Calcium: 9 mg/dL (ref 8.7–10.2)
Chloride: 104 mmol/L (ref 96–106)
Creatinine, Ser: 0.93 mg/dL (ref 0.76–1.27)
Globulin, Total: 1.8 g/dL (ref 1.5–4.5)
Glucose: 117 mg/dL — ABNORMAL HIGH (ref 70–99)
Potassium: 3.8 mmol/L (ref 3.5–5.2)
Sodium: 142 mmol/L (ref 134–144)
Total Protein: 6.2 g/dL (ref 6.0–8.5)
eGFR: 98 mL/min/{1.73_m2} (ref >59)

## 2023-07-01 LAB — HEMOGLOBIN A1C
Est. average glucose Bld gHb Est-mCnc: 177 mg/dL
Hgb A1c MFr Bld: 7.8 % — ABNORMAL HIGH (ref 4.8–5.6)

## 2023-07-27 LAB — HM DIABETES EYE EXAM

## 2023-08-01 ENCOUNTER — Encounter: Payer: Self-pay | Admitting: Internal Medicine

## 2023-08-03 ENCOUNTER — Other Ambulatory Visit: Payer: Self-pay | Admitting: Internal Medicine

## 2023-08-03 DIAGNOSIS — E118 Type 2 diabetes mellitus with unspecified complications: Secondary | ICD-10-CM

## 2023-08-03 NOTE — Telephone Encounter (Signed)
 Requested Prescriptions  Pending Prescriptions Disp Refills   dapagliflozin propanediol (FARXIGA) 10 MG TABS tablet [Pharmacy Med Name: FARXIGA TABS 10MG ] 90 tablet 0    Sig: TAKE 1 TABLET DAILY BEFORE BREAKFAST     Endocrinology:  Diabetes - SGLT2 Inhibitors Passed - 08/03/2023  3:58 PM      Passed - Cr in normal range and within 360 days    Creatinine  Date Value Ref Range Status  07/31/2012 1.03 0.60 - 1.30 mg/dL Final   Creatinine, Ser  Date Value Ref Range Status  06/30/2023 0.93 0.76 - 1.27 mg/dL Final         Passed - HBA1C is between 0 and 7.9 and within 180 days    Hgb A1c MFr Bld  Date Value Ref Range Status  06/30/2023 7.8 (H) 4.8 - 5.6 % Final    Comment:             Prediabetes: 5.7 - 6.4          Diabetes: >6.4          Glycemic control for adults with diabetes: <7.0          Passed - eGFR in normal range and within 360 days    EGFR (African American)  Date Value Ref Range Status  07/31/2012 >60  Final   GFR calc Af Amer  Date Value Ref Range Status  02/20/2020 103 >59 mL/min/1.73 Final    Comment:    **[facility] currently reports eGFR in compliance with the current**   recommendations of the [employer]. [facility] will   update reporting as new guidelines are published from the NKF-ASN   Task force.    EGFR (Non-African Amer.)  Date Value Ref Range Status  07/31/2012 >60  Final    Comment:    eGFR values <72mL/min/1.73 m2 may be an indication of chronic kidney disease (CKD). Calculated eGFR is useful in patients with stable renal function. The eGFR calculation will not be reliable in acutely ill patients when serum creatinine is changing rapidly. It is not useful in  patients on dialysis. The eGFR calculation may not be applicable to patients at the low and high extremes of body sizes, pregnant women, and vegetarians.    GFR, Estimated  Date Value Ref Range Status  02/08/2022 >60 >60 mL/min Final    Comment:    (NOTE) Calculated  using the CKD-EPI Creatinine Equation (2021)    eGFR  Date Value Ref Range Status  06/30/2023 98 >59 mL/min/1.73 Final         Passed - Valid encounter within last 6 months    Recent Outpatient Visits           1 month ago Type II diabetes mellitus with complication [facility])   [place] [facility] at [facility], Nyoka Cowden, MD   5 months ago Annual physical exam   [facility] at [facility], Nyoka Cowden, MD   9 months ago Essential hypertension   [place] [facility] Medicine at [facility], Nyoka Cowden, MD   1 year ago Essential hypertension    Primary Care & Sports Medicine at [facility], Nyoka Cowden, MD   1 year ago Annual physical exam   [facility] Medicine at [facility], Nyoka Cowden, MD       Future Appointments  In 2 months Richardo Hanks, Laurette Schimke, MD [facility] [place]   In 2 months Reubin Milan, MD [facility] Medicine at [facility]   In 7 months Judithann Graves Nyoka Cowden, MD [facility] Primary [facility] Medicine at [facility]             metFORMIN (GLUCOPHAGE-XR) 500 MG 24 hr tablet [Pharmacy Med Name: METFORMIN HCL ER TABS 500MG ] 180 tablet 0    Sig: TAKE 2 TABLETS DAILY WITH BREAKFAST     Endocrinology:  Diabetes - Biguanides Failed - 08/03/2023  3:58 PM      Failed - B12 Level in normal range and within 720 days    No results found for: "VITAMINB12"       Passed - Cr in normal range and within 360 days    Creatinine  Date Value Ref Range Status  07/31/2012 1.03 0.60 - 1.30 mg/dL Final   Creatinine, Ser  Date Value Ref Range Status  06/30/2023 0.93 0.76 - 1.27 mg/dL Final         Passed - HBA1C is between 0 and 7.9 and within 180 days    Hgb A1c MFr Bld  Date Value Ref Range Status  06/30/2023 7.8 (H) 4.8 - 5.6 % Final    Comment:              Prediabetes: 5.7 - 6.4          Diabetes: >6.4          Glycemic control for adults with diabetes: <7.0          Passed - eGFR in normal range and within 360 days    EGFR (African American)  Date Value Ref Range Status  07/31/2012 >60  Final   GFR calc Af Amer  Date Value Ref Range Status  02/20/2020 103 >59 mL/min/1.73 Final    Comment:    **[facility] currently reports eGFR in compliance with the current**   recommendations of the [employer]. [facility] will   update reporting as new guidelines are published from the NKF-ASN   Task force.    EGFR (Non-African Amer.)  Date Value Ref Range Status  07/31/2012 >60  Final    Comment:    eGFR values <31mL/min/1.73 m2 may be an indication of chronic kidney disease (CKD). Calculated eGFR is useful in patients with stable renal function. The eGFR calculation will not be reliable in acutely ill patients when serum creatinine is changing rapidly. It is not useful in  patients on dialysis. The eGFR calculation may not be applicable to patients at the low and high extremes of body sizes, pregnant women, and vegetarians.    GFR, Estimated  Date Value Ref Range Status  02/08/2022 >60 >60 mL/min Final    Comment:    (NOTE) Calculated using the CKD-EPI Creatinine Equation (2021)    eGFR  Date Value Ref Range Status  06/30/2023 98 >59 mL/min/1.73 Final         Passed - Valid encounter within last 6 months    Recent Outpatient Visits           1 month ago Type II diabetes mellitus with complication [facility])   [place] [facility] Medicine at [facility], Nyoka Cowden, MD   5 months ago Annual physical exam   [facility] Medicine at [facility], Nyoka Cowden, MD   9 months ago Essential hypertension  [facility] Medicine at [facility], Nyoka Cowden, MD   1 year ago Essential hypertension   [place] [facility] Care & Sports Medicine at  [facility], Nyoka Cowden, MD   1 year ago Annual physical exam   [facility] Primary [facility] Medicine at [facility], Nyoka Cowden, MD       Future Appointments             In 2 months Richardo Hanks Laurette Schimke, MD [facility]rology [place]   In 2 months Reubin Milan, MD [facility] Primary Care & Sports Medicine at [facility], [facility]   In 7 months Reubin Milan, MD [facility] Primary Care & Sports Medicine at [facility]            Passed - CBC within normal limits and completed in the last 12 months    WBC  Date Value Ref Range Status  02/28/2023 7.8 3.4 - 10.8 x10E3/uL Final  02/08/2022 8.7 4.0 - 10.5 K/uL Final   RBC  Date Value Ref Range Status  02/28/2023 5.40 4.14 - 5.80 x10E6/uL Final  02/08/2022 5.25 4.22 - 5.81 MIL/uL Final   Hemoglobin  Date Value Ref Range Status  02/28/2023 16.7 13.0 - 17.7 g/dL Final   Hematocrit  Date Value Ref Range Status  02/28/2023 48.9 37.5 - 51.0 % Final   MCHC  Date Value Ref Range Status  02/28/2023 34.2 31.5 - 35.7 g/dL Final  11/91/4782 95.6 (H) 30.0 - 36.0 g/dL Final   [facility]  Date Value Ref Range Status  02/28/2023 30.9 26.6 - 33.0 pg Final  02/08/2022 31.2 26.0 - 34.0 pg Final   MCV  Date Value Ref Range Status  02/28/2023 91 79 - 97 fL Final  07/31/2012 88 80 - 100 fL Final   No results found for: "PLTCOUNTKUC", "LABPLAT", "POCPLA" RDW  Date Value Ref Range Status  02/28/2023 12.6 11.6 - 15.4 % Final  07/31/2012 12.3 11.5 - 14.5 % Final

## 2023-10-04 ENCOUNTER — Encounter: Payer: Self-pay | Admitting: Urology

## 2023-10-04 ENCOUNTER — Other Ambulatory Visit
Admission: RE | Admit: 2023-10-04 | Discharge: 2023-10-04 | Disposition: A | Discharge status: Home / Self Care | Attending: Urology | Admitting: Urology

## 2023-10-04 ENCOUNTER — Ambulatory Visit (INDEPENDENT_AMBULATORY_CARE_PROVIDER_SITE_OTHER): Payer: Self-pay | Admitting: Urology

## 2023-10-04 VITALS — BP 157/93 | HR 61 | Wt 214.0 lb

## 2023-10-04 DIAGNOSIS — Z87898 Personal history of other specified conditions: Secondary | ICD-10-CM | POA: Diagnosis not present

## 2023-10-04 DIAGNOSIS — R972 Elevated prostate specific antigen [PSA]: Secondary | ICD-10-CM

## 2023-10-04 MED ORDER — TADALAFIL 5 MG PO TABS: 5.0000 mg | ORAL_TABLET | Freq: Every day | ORAL | 3 refills | Status: AC

## 2023-10-04 NOTE — Progress Notes (Signed)
   10/04/2023 8:44 AM   William [name] 02-Apr-1971 [id number]  Reason for visit: Follow up elevated PSA, ED, lower urinary tract symptoms  HPI: 53 year old male who underwent a prostate biopsy in October 2022 for an elevated PSA of 5(9.6% free) which showed a 36 g prostate with only benign prostatic tissue and some chronic inflammation.  Repeat PSA 09/25/2021 decreased slightly to 4.1, and stable at 5.1 in 2024.  We reviewed the AUA guidelines regarding PSA screening and the less than 20% risk of a false negative biopsy, and the need for ongoing PSA screening on a yearly basis.  PSA has not been drawn this year, will check today and call with results.   He continues to take Cialis  5 mg on demand with good results for his ED.    Prior history of some urgency and frequency, but this has improved significantly with dietary strategies including cutting back on sodas and diet drinks.  Also reviewed impact of diabetes on frequency, recent hemoglobin A1c was 7.8.  He denies any urinary symptoms today.  PSA today, call with results.  Consider MRI if PSA rising Cialis  refilled 5 mg as needed, [email or web address] If PSA stable we will continue yearly follow-up and monitoring  Lawerence Pressman, MD  [facility] [address], [address] [place], [place] [address] [phone]

## 2023-10-04 NOTE — Patient Instructions (Signed)
 [email or web address]  Prostate Cancer Screening  Prostate cancer screening is testing that is done to check for the presence of prostate cancer in men. The prostate gland is a walnut-sized gland that is located below the bladder and in front of the rectum in males. The function of the prostate is to add fluid to semen during ejaculation. Prostate cancer is one of the most common types of cancer in men. Who should have prostate cancer screening? Screening recommendations vary based on age and other risk factors, as well as between the professional organizations who make the recommendations. In general, screening is recommended if: You are age 67 to 38 and have an average risk for prostate cancer. You should talk with your health care provider about your need for screening and how often screening should be done. Because most prostate cancers are slow growing and will not cause death, screening in this age group is generally reserved for men who have a 10- to 15-year life expectancy. You are younger than age 76, and you have these risk factors: Having a father, brother, or uncle who has been diagnosed with prostate cancer. The risk is higher if your family member's cancer occurred at an early age or if you have multiple family members with prostate cancer at an early age. Being a male who is [place] or is of [place] or sub-Saharan African descent. In general, screening is not recommended if: You are younger than age 36. You are between the ages of 60 and 88 and you have no risk factors. You are 65 years of age or older. At this age, the risks that screening can cause are greater than the benefits that it may provide. If you are at high risk for prostate cancer, your health care provider may recommend that you have screenings more often or that you start screening at a younger age. How is screening for prostate cancer done? The recommended prostate cancer screening test is a blood test called the  prostate-specific antigen (PSA) test. PSA is a protein that is made in the prostate. As you age, your prostate naturally produces more PSA. Abnormally high PSA levels may be caused by: Prostate cancer. An enlarged prostate that is not caused by cancer (benign prostatic hyperplasia, or BPH). This condition is very common in older men. A prostate gland infection (prostatitis) or urinary tract infection. Certain medicines such as male hormones (like testosterone) or other medicines that raise testosterone levels. A rectal exam may be done as part of prostate cancer screening to help provide information about the size of your prostate gland. When a rectal exam is performed, it should be done after the PSA level is drawn to avoid any effect on the results. Depending on the PSA results, you may need more tests, such as: A physical exam to check the size of your prostate gland, if not done as part of screening. Blood and imaging tests. A procedure to remove tissue samples from your prostate gland for testing (biopsy). This is the only way to know for certain if you have prostate cancer. What are the benefits of prostate cancer screening? Screening can help to identify cancer at an early stage, before symptoms start and when the cancer can be treated more easily. There is a small chance that screening may lower your risk of dying from prostate cancer. The chance is small because prostate cancer is a slow-growing cancer, and most men with prostate cancer die from a different cause. What are the risks of prostate cancer  screening? The main risk of prostate cancer screening is diagnosing and treating prostate cancer that would never have caused any symptoms or problems. This is called overdiagnosisand overtreatment. PSA screening cannot tell you if your PSA is high due to cancer or a different cause. A prostate biopsy is the only procedure to diagnose prostate cancer. Even the results of a biopsy may not tell you  if your cancer needs to be treated. Slow-growing prostate cancer may not need any treatment other than monitoring, so diagnosing and treating it may cause unnecessary stress or other side effects. Questions to ask your health care provider When should I start prostate cancer screening? What is my risk for prostate cancer? How often do I need screening? What type of screening tests do I need? How do I get my test results? What do my results mean? Do I need treatment? Where to find more information The American Cancer Society: www.cancer.org American Urological Association: www.auanet.org Contact a health care provider if: You have difficulty urinating. You have pain when you urinate or ejaculate. You have blood in your urine or semen. You have pain in your back or in the area of your prostate. Summary Prostate cancer is a common type of cancer in men. The prostate gland is located below the bladder and in front of the rectum. This gland adds fluid to semen during ejaculation. Prostate cancer screening may identify cancer at an early stage, when the cancer can be treated more easily and is less likely to have spread to other areas of the body. The prostate-specific antigen (PSA) test is the recommended screening test for prostate cancer, but it has associated risks. Discuss the risks and benefits of prostate cancer screening with your health care provider. If you are age 36 or older, the risks that screening can cause are greater than the benefits that it may provide. This information is not intended to replace advice given to you by your health care provider. Make sure you discuss any questions you have with your health care provider. Document Revised: 11/24/2020 Document Reviewed: 11/24/2020 Elsevier Patient Education  2024 [employer].

## 2023-10-05 LAB — PSA, TOTAL AND FREE
PSA, Free Pct: 8.9 %
PSA, Free: 0.47 ng/mL
Prostate Specific Ag, Serum: 5.3 ng/mL — ABNORMAL HIGH (ref 0.0–4.0)

## 2023-10-06 DIAGNOSIS — R972 Elevated prostate specific antigen [PSA]: Secondary | ICD-10-CM

## 2023-10-06 NOTE — Telephone Encounter (Signed)
 Appt scheduled. Lab ordered.

## 2023-10-28 ENCOUNTER — Encounter: Payer: Self-pay | Admitting: Internal Medicine

## 2023-10-28 ENCOUNTER — Ambulatory Visit (INDEPENDENT_AMBULATORY_CARE_PROVIDER_SITE_OTHER): Payer: Self-pay | Admitting: Internal Medicine

## 2023-10-28 VITALS — BP 116/70 | HR 62 | Ht 67.0 in | Wt 217.0 lb

## 2023-10-28 DIAGNOSIS — Z7984 Long term (current) use of oral hypoglycemic drugs: Secondary | ICD-10-CM

## 2023-10-28 DIAGNOSIS — I10 Essential (primary) hypertension: Secondary | ICD-10-CM

## 2023-10-28 DIAGNOSIS — E118 Type 2 diabetes mellitus with unspecified complications: Secondary | ICD-10-CM | POA: Diagnosis not present

## 2023-10-28 LAB — POCT GLYCOSYLATED HEMOGLOBIN (HGB A1C): Hemoglobin A1C: 7 % — AB (ref 4.0–5.6)

## 2023-10-28 MED ORDER — HYDROCHLOROTHIAZIDE 25 MG PO TABS: 25.0000 mg | ORAL_TABLET | Freq: Every day | ORAL | 1 refills | Status: DC

## 2023-10-28 MED ORDER — GLIMEPIRIDE 2 MG PO TABS: 2.0000 mg | ORAL_TABLET | Freq: Every day | ORAL | 1 refills | Status: AC

## 2023-10-28 MED ORDER — DAPAGLIFLOZIN PROPANEDIOL 10 MG PO TABS: 10.0000 mg | ORAL_TABLET | Freq: Every day | ORAL | 1 refills | Status: DC

## 2023-10-28 MED ORDER — AMLODIPINE BESYLATE 10 MG PO TABS: 10.0000 mg | ORAL_TABLET | Freq: Every day | ORAL | 1 refills | Status: DC

## 2023-10-28 NOTE — Assessment & Plan Note (Signed)
 Blood pressure is well controlled.  Current medications are amlodipine  and hydrochlorothiazide . Will continue same regimen along with efforts to limit dietary sodium.

## 2023-10-28 NOTE — Progress Notes (Signed)
 Date:  10/28/2023   Name:  William [name]   DOB:  1970/12/31   MRN:  [id number]   Chief Complaint: Hypertension and Diabetes  Hypertension This is a chronic problem. The problem is controlled (was high recently at Urology). Pertinent negatives include no chest pain, headaches, palpitations or shortness of breath. Past treatments include calcium  channel blockers and diuretics. The current treatment provides significant improvement. There is no history of kidney disease, CAD/MI or CVA.  Diabetes He presents for his follow-up diabetic visit. He has type 2 diabetes mellitus. His disease course has been stable. Pertinent negatives for hypoglycemia include no dizziness, headaches or nervousness/anxiousness. Pertinent negatives for diabetes include no chest pain, no fatigue and no weakness. Symptoms are improving. Pertinent negatives for diabetic complications include no CVA. Current diabetic treatments: MTF, Farxiga , glimepiride . He is following a generally healthy diet. When asked about meal planning, he reported none. His home blood glucose trend is decreasing steadily. An ACE inhibitor/angiotensin II receptor blocker is not being taken.    Review of Systems  Constitutional:  Negative for fatigue and unexpected weight change.  HENT:  Negative for nosebleeds.   Eyes:  Negative for visual disturbance.  Respiratory:  Negative for cough, chest tightness, shortness of breath and wheezing.   Cardiovascular:  Negative for chest pain, palpitations and leg swelling.  Gastrointestinal:  Negative for abdominal pain, constipation and diarrhea.  Neurological:  Negative for dizziness, weakness, light-headedness and headaches.  Psychiatric/Behavioral:  Negative for dysphoric mood and sleep disturbance. The patient is not nervous/anxious.      Lab Results  Component Value Date   NA 142 06/30/2023   K 3.8 06/30/2023   CO2 19 (L) 06/30/2023   GLUCOSE 117 (H) 06/30/2023   BUN 15 06/30/2023   CREATININE  0.93 06/30/2023   CALCIUM  9.0 06/30/2023   EGFR 98 06/30/2023   GFRNONAA >60 02/08/2022   Lab Results  Component Value Date   CHOL 131 06/30/2023   HDL 29 (L) 06/30/2023   LDLCALC 82 06/30/2023   TRIG 110 06/30/2023   CHOLHDL 4.5 06/30/2023   Lab Results  Component Value Date   TSH 2.490 06/24/2016   Lab Results  Component Value Date   HGBA1C 7.0 (A) 10/28/2023   Lab Results  Component Value Date   WBC 7.8 02/28/2023   HGB 16.7 02/28/2023   HCT 48.9 02/28/2023   MCV 91 02/28/2023   PLT 223 02/28/2023   Lab Results  Component Value Date   ALT 29 06/30/2023   AST 22 06/30/2023   ALKPHOS 73 06/30/2023   BILITOT 0.8 06/30/2023   No results found for: "25OHVITD2", "25OHVITD3", "VD25OH"   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    No Known Allergies  Past Surgical History:  Procedure Laterality Date   COLONOSCOPY WITH PROPOFOL  N/A 08/21/2019   Procedure: COLONOSCOPY WITH BIOPSY;  Surgeon: Selena Daily, MD;  Location: [facility];  Service: Endoscopy;  Laterality: N/A;  Requests late as possible   NO PAST SURGERIES     none     POLYPECTOMY N/A 08/21/2019   Procedure: POLYPECTOMY;  Surgeon: Selena Daily, MD;  Location: [facility];  Service: Endoscopy;  Laterality: N/A;    Social History   Tobacco Use   Smoking status: Never   Smokeless tobacco: Never  Vaping Use   Vaping status: Never Used  Substance Use Topics   Alcohol use: Yes    Alcohol/week: 1.0 standard drink of alcohol    Types: 1 Standard drinks or equivalent per week     Comment: couple times/year   Drug use: No     Medication list has been reviewed and updated.  Current Meds  Medication Sig   Accu-Chek Softclix Lancets lancets USE AS DIRECTED   aspirin 81 MG EC tablet Take by mouth.   atorvastatin  (LIPITOR) 20 MG tablet Take 1 tablet (20 mg total) by mouth daily.   blood glucose meter kit and supplies KIT Dispense based on patient and insurance preference. Use up to four times daily as directed.   Blood Glucose Monitoring Suppl (FREESTYLE LITE) DEVI 1 each by Other route 2 (two) times daily.   Glucose Blood (BLOOD GLUCOSE TEST STRIPS 333) STRP Use up to 4 times daily as directed   metFORMIN  (GLUCOPHAGE -XR) 500 MG 24 hr tablet TAKE 2 TABLETS DAILY WITH BREAKFAST   tadalafil  (CIALIS ) 5 MG tablet Take 1 tablet (5 mg total) by mouth daily.   [DISCONTINUED] amLODipine  (NORVASC ) 10 MG tablet Take 1 tablet (10 mg total) by mouth daily.   [DISCONTINUED] dapagliflozin  propanediol (FARXIGA ) 10 MG TABS tablet TAKE 1 TABLET DAILY BEFORE BREAKFAST   [DISCONTINUED] glimepiride  (AMARYL ) 2 MG tablet Take 1 tablet (2 mg total) by mouth daily before breakfast.   [DISCONTINUED] hydrochlorothiazide  (HYDRODIURIL ) 25 MG tablet Take 1 tablet (25 mg total) by mouth daily.       10/28/2023    8:41 AM 06/30/2023    8:09 AM 02/28/2023    8:11 AM 10/11/2022    3:25 PM  GAD 7 : Generalized Anxiety Score  Nervous, Anxious, on Edge 0 0 0 0  Control/stop worrying 0 0 0 0  Worry too much - different things 0 0 0 0  Trouble relaxing 0 0 0 0  Restless 0 0 0 0  Easily annoyed or irritable 0 0 0 0  Afraid - awful might happen 0 0 0 0  Total GAD 7 Score 0 0 0 0  Anxiety Difficulty Not difficult at all Not difficult at all Not difficult at all Not difficult at all       10/28/2023    8:40 AM 06/30/2023    8:09 AM 02/28/2023    8:11 AM  Depression screen PHQ 2/9  Decreased Interest 0 0 0  Down, Depressed, Hopeless 0 0 0  PHQ - 2 Score 0 0 0  Altered sleeping 0 0 0  Tired,  decreased energy 0 0 0  Change in appetite 0 0 0  Feeling bad or failure about yourself  0 0 0  Trouble concentrating 0 0 0  Moving slowly or fidgety/restless 0 0 0  Suicidal thoughts 0 0 0  PHQ-9 Score 0 0 0  Difficult doing work/chores Not difficult at all Not difficult at all Not difficult at all    BP Readings from Last 3 Encounters:  10/28/23 116/70  10/04/23 (!) 157/93  06/30/23 122/68    Physical Exam Vitals and nursing note reviewed.  Constitutional:      General: He is not in acute distress.    Appearance: He is well-developed.  HENT:  Head: Normocephalic and atraumatic.  Neck:     Vascular: No carotid bruit.  Cardiovascular:     Rate and Rhythm: Normal rate and regular rhythm.     Pulses: Normal pulses.     Heart sounds: No murmur heard. Pulmonary:     Effort: Pulmonary effort is normal. No respiratory distress.  Musculoskeletal:     Right lower leg: No edema.     Left lower leg: No edema.  Lymphadenopathy:     Cervical: No cervical adenopathy.  Skin:    General: Skin is warm and dry.     Findings: No rash.  Neurological:     General: No focal deficit present.     Mental Status: He is alert and oriented to person, place, and time.  Psychiatric:        Mood and Affect: Mood normal.        Behavior: Behavior normal.     Wt Readings from Last 3 Encounters:  10/28/23 217 lb (98.4 kg)  10/04/23 214 lb (97.1 kg)  06/30/23 219 lb (99.3 kg)    BP 116/70   Pulse 62   Ht 5\' 7"  (1.702 m)   Wt 217 lb (98.4 kg)   SpO2 97%   BMI 33.99 kg/m   Assessment and Plan:  Problem List Items Addressed This Visit       Unprioritized   Essential hypertension (Chronic)   Blood pressure is well controlled.  Current medications are amlodipine  and hydrochlorothiazide . Will continue same regimen along with efforts to limit dietary sodium.       Relevant Medications   hydrochlorothiazide  (HYDRODIURIL ) 25 MG tablet   amLODipine  (NORVASC ) 10 MG tablet   Type  II diabetes mellitus with complication (HCC) - Primary (Chronic)   Blood sugars have been stable. AM ~ 150 and PM ~ 100. No recent hypoglycemic events requiring assistance. Currently medications are MTF, Farxiga  and glipzide. Lab Results  Component Value Date   HGBA1C 7.8 (H) 06/30/2023   Last visit no changes were made. A1C today = 7.0.  Will continue same therapy, diet changes.       Relevant Medications   glimepiride  (AMARYL ) 2 MG tablet   dapagliflozin  propanediol (FARXIGA ) 10 MG TABS tablet   Other Relevant Orders   POCT glycosylated hemoglobin (Hb A1C) (Completed)   Other Visit Diagnoses       Long term current use of oral hypoglycemic drug           No follow-ups on file.    Sheron Dixons, MD [facility] Primary Care and Sports Medicine [place]

## 2023-10-28 NOTE — Assessment & Plan Note (Addendum)
 Blood sugars have been stable. AM ~ 150 and PM ~ 100. No recent hypoglycemic events requiring assistance. Currently medications are MTF, Farxiga  and glipzide. Lab Results  Component Value Date   HGBA1C 7.8 (H) 06/30/2023   Last visit no changes were made. A1C today = 7.0.  Will continue same therapy, diet changes.

## 2023-11-01 ENCOUNTER — Other Ambulatory Visit: Payer: Self-pay | Admitting: Internal Medicine

## 2023-11-01 DIAGNOSIS — E118 Type 2 diabetes mellitus with unspecified complications: Secondary | ICD-10-CM

## 2023-11-02 NOTE — Telephone Encounter (Signed)
 Requested Prescriptions  Pending Prescriptions Disp Refills   metFORMIN  (GLUCOPHAGE -XR) 500 MG 24 hr tablet [Pharmacy Med Name: METFORMIN  HCL ER TABS 500MG ] 180 tablet 1    Sig: TAKE 2 TABLETS DAILY WITH BREAKFAST     Endocrinology:  Diabetes - Biguanides Failed - 11/02/2023  2:02 PM      Failed - B12 Level in normal range and within 720 days    No results found for: "VITAMINB12"       Passed - Cr in normal range and within 360 days    Creatinine  Date Value Ref Range Status  07/31/2012 1.03 0.60 - 1.30 mg/dL Final   Creatinine, Ser  Date Value Ref Range Status  06/30/2023 0.93 0.76 - 1.27 mg/dL Final         Passed - HBA1C is between 0 and 7.9 and within 180 days    Hemoglobin A1C  Date Value Ref Range Status  10/28/2023 7.0 (A) 4.0 - 5.6 % Final   Hgb A1c MFr Bld  Date Value Ref Range Status  06/30/2023 7.8 (H) 4.8 - 5.6 % Final    Comment:             Prediabetes: 5.7 - 6.4          Diabetes: >6.4          Glycemic control for adults with diabetes: <7.0          Passed - eGFR in normal range and within 360 days    EGFR (African American)  Date Value Ref Range Status  07/31/2012 >60  Final   GFR calc Af Amer  Date Value Ref Range Status  02/20/2020 103 >59 mL/min/1.73 Final    Comment:    **[facility] currently reports eGFR in compliance with the current**   recommendations of the [employer]. [facility] will   update reporting as new guidelines are published from the NKF-ASN   Task force.    EGFR (Non-African Amer.)  Date Value Ref Range Status  07/31/2012 >60  Final    Comment:    eGFR values <83mL/min/1.73 m2 may be an indication of chronic kidney disease (CKD). Calculated eGFR is useful in patients with stable renal function. The eGFR calculation will not be reliable in acutely ill patients when serum creatinine is changing rapidly. It is not useful in  patients on dialysis. The eGFR calculation may not be applicable to patients at the low and  high extremes of body sizes, pregnant women, and vegetarians.    GFR, Estimated  Date Value Ref Range Status  02/08/2022 >60 >60 mL/min Final    Comment:    (NOTE) Calculated using the CKD-EPI Creatinine Equation (2021)    eGFR  Date Value Ref Range Status  06/30/2023 98 >59 mL/min/1.73 Final         Passed - Valid encounter within last 6 months    Recent Outpatient Visits           5 days ago Type II diabetes mellitus with complication [facility])   [place] [facility] Medicine at [facility], Chales [place], MD       Future Appointments             In 4 months Gala Jubilee, Chales [place], MD [facility] Primary Care & Sports Medicine at [facility], [facility]   In 11 months Estanislao Heimlich, Dennard Fisher, MD [facility]rology [place]            Passed - CBC within  normal limits and completed in the last 12 months    WBC  Date Value Ref Range Status  02/28/2023 7.8 3.4 - 10.8 x10E3/uL Final  02/08/2022 8.7 4.0 - 10.5 K/uL Final   RBC  Date Value Ref Range Status  02/28/2023 5.40 4.14 - 5.80 x10E6/uL Final  02/08/2022 5.25 4.22 - 5.81 MIL/uL Final   Hemoglobin  Date Value Ref Range Status  02/28/2023 16.7 13.0 - 17.7 g/dL Final   Hematocrit  Date Value Ref Range Status  02/28/2023 48.9 37.5 - 51.0 % Final   MCHC  Date Value Ref Range Status  02/28/2023 34.2 31.5 - 35.7 g/dL Final  57/84/6962 95.2 (H) 30.0 - 36.0 g/dL Final   [facility]  Date Value Ref Range Status  02/28/2023 30.9 26.6 - 33.0 pg Final  02/08/2022 31.2 26.0 - 34.0 pg Final   MCV  Date Value Ref Range Status  02/28/2023 91 79 - 97 fL Final  07/31/2012 88 80 - 100 fL Final   No results found for: "PLTCOUNTKUC", "LABPLAT", "POCPLA" RDW  Date Value Ref Range Status  02/28/2023 12.6 11.6 - 15.4 % Final  07/31/2012 12.3 11.5 - 14.5 % Final          FARXIGA  10 MG TABS tablet [Pharmacy Med Name: FARXIGA  TABS 10MG ] 90 tablet 3    Sig: TAKE 1 TABLET DAILY BEFORE BREAKFAST     Endocrinology:   Diabetes - SGLT2 Inhibitors Passed - 11/02/2023  2:02 PM      Passed - Cr in normal range and within 360 days    Creatinine  Date Value Ref Range Status  07/31/2012 1.03 0.60 - 1.30 mg/dL Final   Creatinine, Ser  Date Value Ref Range Status  06/30/2023 0.93 0.76 - 1.27 mg/dL Final         Passed - HBA1C is between 0 and 7.9 and within 180 days    Hemoglobin A1C  Date Value Ref Range Status  10/28/2023 7.0 (A) 4.0 - 5.6 % Final   Hgb A1c MFr Bld  Date Value Ref Range Status  06/30/2023 7.8 (H) 4.8 - 5.6 % Final    Comment:             Prediabetes: 5.7 - 6.4          Diabetes: >6.4          Glycemic control for adults with diabetes: <7.0          Passed - eGFR in normal range and within 360 days    EGFR (African American)  Date Value Ref Range Status  07/31/2012 >60  Final   GFR calc Af Amer  Date Value Ref Range Status  02/20/2020 103 >59 mL/min/1.73 Final    Comment:    **[facility] currently reports eGFR in compliance with the current**   recommendations of the [employer]. [facility] will   update reporting as new guidelines are published from the NKF-ASN   Task force.    EGFR (Non-African Amer.)  Date Value Ref Range Status  07/31/2012 >60  Final    Comment:    eGFR values <21mL/min/1.73 m2 may be an indication of chronic kidney disease (CKD). Calculated eGFR is useful in patients with stable renal function. The eGFR calculation will not be reliable in acutely ill patients when serum creatinine is changing rapidly. It is not useful in  patients on dialysis. The eGFR calculation may not be applicable to patients at the low and high extremes of body sizes, pregnant women,  and vegetarians.    GFR, Estimated  Date Value Ref Range Status  02/08/2022 >60 >60 mL/min Final    Comment:    (NOTE) Calculated using the CKD-EPI Creatinine Equation (2021)    eGFR  Date Value Ref Range Status  06/30/2023 98 >59 mL/min/1.73 Final         Passed - Valid  encounter within last 6 months    Recent Outpatient Visits           5 days ago Type II diabetes mellitus with complication [facility])   [place] [facility] Medicine at [facility], Chales [place], MD       Future Appointments             In 4 months Gala Jubilee, Chales [place], MD [facility] Health Primary Care & Sports Medicine at [facility], [facility]   In 11 months Estanislao Heimlich, Dennard Fisher, MD [facility] Health Urology [place]

## 2023-12-28 ENCOUNTER — Ambulatory Visit (INDEPENDENT_AMBULATORY_CARE_PROVIDER_SITE_OTHER): Admitting: Internal Medicine

## 2023-12-28 ENCOUNTER — Encounter: Payer: Self-pay | Admitting: Internal Medicine

## 2023-12-28 VITALS — BP 122/78 | HR 70 | Ht 67.0 in | Wt 224.0 lb

## 2023-12-28 DIAGNOSIS — A530 Latent syphilis, unspecified as early or late: Secondary | ICD-10-CM

## 2023-12-28 NOTE — Progress Notes (Signed)
 Date:  12/28/2023   Name:  William [name]   DOB:  1971/02/27   MRN:  [id number]   Chief Complaint: Exposure to STD (Patient coming in today for screening for STD labs. Patient donates blood at [facility] and received papers yesterday saying his blood tested positive on a RPR test. Patient would like to discuss and have labs today.)  HPI Positive RPR at the plasma center.  He was been donating for a number of years with negative results.  He has one sexual partner for 7 years with no concerns for STI.  He feels well, no skin rash, HA, genital lesions (painless or painful), known STI exposures. A confirmatory test is pending from [facility] (which test is not stated).  Review of Systems  Constitutional:  Negative for chills, fatigue, fever and unexpected weight change.  Respiratory:  Negative for chest tightness and shortness of breath.   Cardiovascular:  Negative for chest pain and leg swelling.  Genitourinary:  Negative for genital sores, hematuria and penile discharge.  Skin:  Negative for rash.  Psychiatric/Behavioral:  Negative for dysphoric mood and sleep disturbance. The patient is not nervous/anxious.      Lab Results  Component Value Date   NA 142 06/30/2023   K 3.8 06/30/2023   CO2 19 (L) 06/30/2023   GLUCOSE 117 (H) 06/30/2023   BUN 15 06/30/2023   CREATININE 0.93 06/30/2023   CALCIUM  9.0 06/30/2023   EGFR 98 06/30/2023   GFRNONAA >60 02/08/2022   Lab Results  Component Value Date   CHOL 131 06/30/2023   HDL 29 (L) 06/30/2023   LDLCALC 82 06/30/2023   TRIG 110 06/30/2023   CHOLHDL 4.5 06/30/2023   Lab Results  Component Value Date   TSH 2.490 06/24/2016   Lab Results  Component Value Date   HGBA1C 7.0 (A) 10/28/2023   Lab Results  Component Value Date   WBC 7.8 02/28/2023   HGB 16.7 02/28/2023   HCT 48.9 02/28/2023   MCV 91 02/28/2023   PLT 223 02/28/2023   Lab Results  Component Value Date   ALT 29 06/30/2023   AST 22 06/30/2023   ALKPHOS 73  06/30/2023   BILITOT 0.8 06/30/2023   No results found for: MARIEN BOLLS, VD25OH   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    No Known Allergies  Past Surgical History:  Procedure Laterality Date   COLONOSCOPY WITH PROPOFOL  N/A 08/21/2019   Procedure: COLONOSCOPY WITH BIOPSY;  Surgeon: Unk Corinn Skiff, MD;  Location: [facility];  Service: Endoscopy;  Laterality: N/A;  Requests late as possible   NO PAST SURGERIES     none     POLYPECTOMY N/A 08/21/2019   Procedure: POLYPECTOMY;  Surgeon: Unk Corinn Skiff, MD;  Location: [facility];  Service: Endoscopy;  Laterality: N/A;    Social History   Tobacco Use   Smoking status: Never   Smokeless tobacco: Never  Vaping Use   Vaping status: Never Used  Substance Use Topics   Alcohol use: Yes    Alcohol/week: 1.0 standard drink of alcohol    Types: 1  Standard drinks or equivalent per week    Comment: couple times/year   Drug use: No     Medication list has been reviewed and updated.  Current Meds  Medication Sig   Accu-Chek Softclix Lancets lancets USE AS DIRECTED   amLODipine  (NORVASC ) 10 MG tablet Take 1 tablet (10 mg total) by mouth daily.   aspirin 81 MG EC tablet Take by mouth.   atorvastatin  (LIPITOR) 20 MG tablet Take 1 tablet (20 mg total) by mouth daily.   blood glucose meter kit and supplies KIT Dispense based on patient and insurance preference. Use up to four times daily as directed.   Blood Glucose Monitoring Suppl (FREESTYLE LITE) DEVI 1 each by Other route 2 (two) times daily.    dapagliflozin  propanediol (FARXIGA ) 10 MG TABS tablet Take 1 tablet (10 mg total) by mouth daily before breakfast.   glimepiride  (AMARYL ) 2 MG tablet Take 1 tablet (2 mg total) by mouth daily before breakfast.   Glucose Blood (BLOOD GLUCOSE TEST STRIPS 333) STRP Use up to 4 times daily as directed   hydrochlorothiazide  (HYDRODIURIL ) 25 MG tablet Take 1 tablet (25 mg total) by mouth daily.   metFORMIN  (GLUCOPHAGE -XR) 500 MG 24 hr tablet TAKE 2 TABLETS DAILY WITH BREAKFAST   tadalafil  (CIALIS ) 5 MG tablet Take 1 tablet (5 mg total) by mouth daily.       10/28/2023    8:41 AM 06/30/2023    8:09 AM 02/28/2023    8:11 AM 10/11/2022    3:25 PM  GAD 7 : Generalized Anxiety Score  Nervous, Anxious, on Edge 0 0 0 0  Control/stop worrying 0 0 0 0  Worry too much - different things 0 0 0 0  Trouble relaxing 0 0 0 0  Restless 0 0 0 0  Easily annoyed or irritable 0 0 0 0  Afraid - awful might happen 0 0 0 0  Total GAD 7 Score 0 0 0 0  Anxiety Difficulty Not difficult at all Not difficult at all Not difficult at all Not difficult at all       10/28/2023    8:40 AM 06/30/2023    8:09 AM 02/28/2023    8:11 AM  Depression screen PHQ 2/9  Decreased Interest 0 0 0  Down, Depressed, Hopeless 0 0 0  PHQ - 2 Score 0 0 0  Altered sleeping 0 0 0  Tired, decreased energy 0 0 0  Change in appetite 0 0 0  Feeling bad or failure about yourself  0 0 0  Trouble concentrating 0 0 0  Moving slowly or fidgety/restless 0 0 0  Suicidal thoughts 0 0 0  PHQ-9 Score 0 0 0  Difficult doing work/chores Not difficult at all Not difficult at all Not difficult at all    BP Readings from Last 3 Encounters:  12/28/23 122/78  10/28/23 116/70  10/04/23 (!) 157/93    Physical Exam Vitals and nursing note reviewed.  Constitutional:      General: He is not in acute distress.    Appearance: Normal appearance. He is well-developed.  HENT:     Head: Normocephalic and atraumatic.  Cardiovascular:     Rate and  Rhythm: Normal rate and regular rhythm.  Pulmonary:     Effort: Pulmonary effort is normal. No respiratory distress.     Breath sounds: No wheezing or rhonchi.  Skin:    General: Skin is warm and dry.     Findings: No rash.  Neurological:  General: No focal deficit present.     Mental Status: He is alert and oriented to person, place, and time.  Psychiatric:        Mood and Affect: Mood normal.        Behavior: Behavior normal.     Wt Readings from Last 3 Encounters:  12/28/23 224 lb (101.6 kg)  10/28/23 217 lb (98.4 kg)  10/04/23 214 lb (97.1 kg)    BP 122/78   Pulse 70   Ht 5' 7 (1.702 m)   Wt 224 lb (101.6 kg)   SpO2 100%   BMI 35.08 kg/m   Assessment and Plan:  Problem List Items Addressed This Visit   None Visit Diagnoses       Positive RPR test    -  Primary   low risk per history - likely false positive will screen for other STIs repeat RPR with reflex if positive   Relevant Orders   Chlamydia/Gonococcus/Trichomonas, NAA   HepB+HepC+HIV Panel   HSV 1 and 2 Ab, IgG   RPR w/reflex to TrepSure       No follow-ups on file.    Leita HILARIO Adie, MD [facility] Primary Care and Sports Medicine [place]

## 2023-12-30 LAB — HSV 1 AND 2 AB, IGG
HSV 1 Glycoprotein G Ab, IgG: REACTIVE — AB
HSV 2 IgG, Type Spec: NONREACTIVE

## 2023-12-30 LAB — HEPB+HEPC+HIV PANEL
HIV Screen 4th Generation wRfx: NONREACTIVE
Hep B C IgM: NEGATIVE
Hep B Core Total Ab: NEGATIVE
Hep B E Ab: NONREACTIVE
Hep B E Ag: NEGATIVE
Hep B Surface Ab, Qual: UNDETERMINED
Hep C Virus Ab: NONREACTIVE
Hepatitis B Surface Ag: NEGATIVE

## 2023-12-30 LAB — RPR W/REFLEX TO TREPSURE: RPR: NONREACTIVE

## 2023-12-30 LAB — TREPONEMAL ANTIBODIES, TPPA: Treponemal Antibodies, TPPA: NONREACTIVE

## 2023-12-31 ENCOUNTER — Ambulatory Visit: Payer: Self-pay | Admitting: Internal Medicine

## 2023-12-31 LAB — CHLAMYDIA/GONOCOCCUS/TRICHOMONAS, NAA
Chlamydia by NAA: NEGATIVE
Gonococcus by NAA: NEGATIVE
Trich vag by NAA: NEGATIVE

## 2024-02-03 ENCOUNTER — Other Ambulatory Visit: Payer: Self-pay | Admitting: Internal Medicine

## 2024-02-03 DIAGNOSIS — E1169 Type 2 diabetes mellitus with other specified complication: Secondary | ICD-10-CM

## 2024-02-03 NOTE — Telephone Encounter (Signed)
 Requested Prescriptions  Pending Prescriptions Disp Refills   atorvastatin  (LIPITOR) 20 MG tablet [Pharmacy Med Name: Atorvastatin  Calcium  20 MG Oral Tablet] 90 tablet 0    Sig: Take 1/2 (one-half) tablet by mouth once daily     Cardiovascular:  Antilipid - Statins Failed - 02/03/2024  5:06 PM      Failed - Lipid Panel in normal range within the last 12 months    Cholesterol, Total  Date Value Ref Range Status  06/30/2023 131 100 - 199 mg/dL Final   LDL Chol Calc (NIH)  Date Value Ref Range Status  06/30/2023 82 0 - 99 mg/dL Final   HDL  Date Value Ref Range Status  06/30/2023 29 (L) >39 mg/dL Final   Triglycerides  Date Value Ref Range Status  06/30/2023 110 0 - 149 mg/dL Final         Passed - Patient is not pregnant      Passed - Valid encounter within last 12 months    Recent Outpatient Visits           1 month ago Positive RPR test   [facility] Primary Care & Sports Medicine at [facility], Leita DEL, MD   3 months ago Type II diabetes mellitus with complication [facility])   [place] Primary Care & Sports Medicine at [facility], Leita DEL, MD       Future Appointments             In 3 weeks Justus, Leita DEL, MD [facility] Health Primary Care & Sports Medicine at [facility], [facility]   In 8 months Francisca, Redell BROCKS, MD [facility] Urology [place]

## 2024-03-01 ENCOUNTER — Ambulatory Visit (INDEPENDENT_AMBULATORY_CARE_PROVIDER_SITE_OTHER): Payer: Self-pay | Admitting: Internal Medicine

## 2024-03-01 ENCOUNTER — Encounter: Payer: Self-pay | Admitting: Internal Medicine

## 2024-03-01 VITALS — BP 124/78 | HR 61 | Resp 18 | Ht 67.0 in | Wt 217.0 lb

## 2024-03-01 DIAGNOSIS — E118 Type 2 diabetes mellitus with unspecified complications: Secondary | ICD-10-CM

## 2024-03-01 DIAGNOSIS — I10 Essential (primary) hypertension: Secondary | ICD-10-CM | POA: Diagnosis not present

## 2024-03-01 DIAGNOSIS — Z7984 Long term (current) use of oral hypoglycemic drugs: Secondary | ICD-10-CM

## 2024-03-01 DIAGNOSIS — Z0000 Encounter for general adult medical examination without abnormal findings: Secondary | ICD-10-CM

## 2024-03-01 DIAGNOSIS — E1169 Type 2 diabetes mellitus with other specified complication: Secondary | ICD-10-CM | POA: Diagnosis not present

## 2024-03-01 DIAGNOSIS — R972 Elevated prostate specific antigen [PSA]: Secondary | ICD-10-CM

## 2024-03-01 DIAGNOSIS — E785 Hyperlipidemia, unspecified: Secondary | ICD-10-CM | POA: Diagnosis not present

## 2024-03-01 MED ORDER — ATORVASTATIN CALCIUM 20 MG PO TABS: 20.0000 mg | ORAL_TABLET | Freq: Every day | ORAL | 1 refills | Status: AC

## 2024-03-01 NOTE — Assessment & Plan Note (Signed)
 LDL is  Lab Results  Component Value Date   LDLCALC 82 06/30/2023   Currently taking atorvastatin  20 mg.  No medication side effects or other concerns. Recommended LDL goal is < 70.

## 2024-03-01 NOTE — Assessment & Plan Note (Signed)
 Blood sugars have been stable.  No hypoglycemic events since last visit. Currently medications are MTF, Farxiga  and glipizide. Last visit medical regimen changes were none. Lab Results  Component Value Date   HGBA1C 7.0 (A) 10/28/2023

## 2024-03-01 NOTE — Assessment & Plan Note (Signed)
 Blood pressure is well controlled on amlodipine  and hydrochlorothiazide . No medication side effects noted. Plan to continue current medications.

## 2024-03-01 NOTE — Progress Notes (Signed)
 Date:  03/01/2024   Name:  William [name]   DOB:  November 26, 1970   MRN:  [id number]   Chief Complaint: Annual Exam William [name] is a 53 y.o. male who presents today for his Complete Annual Exam. He feels well. He reports exercising none. He reports he is sleeping well.   Health Maintenance  Topic Date Due   Zoster (Shingles) Vaccine (1 of 2) Never done   Hepatitis B Vaccine (2 of 3 - 19+ 3-dose series) 12/16/2014   DTaP/Tdap/Td vaccine (2 - Td or Tdap) 06/29/2023   Yearly kidney health urinalysis for diabetes  02/28/2024   COVID-19 Vaccine (1) 03/17/2024*   Flu Shot  09/11/2024*   Pneumococcal Vaccine for age over 40 (1 of 2 - PCV) 03/01/2025*   Hemoglobin A1C  04/29/2024   Yearly kidney function blood test for diabetes  06/29/2024   Eye exam for diabetics  07/26/2024   Complete foot exam   03/01/2025   Colon Cancer Screening  08/21/2026   Hepatitis C Screening  Completed   HIV Screening  Completed   HPV Vaccine  Aged Out   Meningitis B Vaccine  Aged Out  *Topic was postponed. The date shown is not the original due date.    Lab Results  Component Value Date   PSA1 5.3 (H) 10/04/2023   PSA1 5.1 (H) 09/28/2022   PSA1 5.0 (H) 02/17/2021   PSA 4.11 09/25/2021    Hypertension This is a chronic problem. The problem is controlled. Pertinent negatives include no chest pain, headaches, palpitations or shortness of breath. Past treatments include calcium  channel blockers and diuretics. The current treatment provides significant improvement. There is no history of kidney disease, CAD/MI or CVA.  Diabetes He presents for his follow-up diabetic visit. He has type 2 diabetes mellitus. His disease course has been improving. Pertinent negatives for hypoglycemia include no dizziness or headaches. Pertinent negatives for diabetes include no chest pain, no fatigue and no weakness. Pertinent negatives for diabetic complications include no CVA.  Hyperlipidemia This is a chronic problem. The  problem is controlled. Pertinent negatives include no chest pain or shortness of breath. Current antihyperlipidemic treatment includes statins. The current treatment provides significant improvement of lipids.    Review of Systems  Constitutional:  Negative for fatigue and unexpected weight change.  HENT:  Negative for nosebleeds.   Eyes:  Negative for visual disturbance.  Respiratory:  Negative for cough, chest tightness, shortness of breath and wheezing.   Cardiovascular:  Negative for chest pain, palpitations and leg swelling.  Gastrointestinal:  Negative for abdominal pain, constipation and diarrhea.  Neurological:  Negative for dizziness, weakness, light-headedness and headaches.     Lab Results  Component Value Date   NA 142 06/30/2023   K 3.8 06/30/2023   CO2 19 (L) 06/30/2023   GLUCOSE 117 (H) 06/30/2023   BUN 15 06/30/2023   CREATININE 0.93 06/30/2023   CALCIUM  9.0 06/30/2023   EGFR 98 06/30/2023   GFRNONAA >60 02/08/2022   Lab Results  Component Value Date   CHOL 131 06/30/2023   HDL 29 (L) 06/30/2023   LDLCALC 82 06/30/2023   TRIG 110 06/30/2023   CHOLHDL 4.5 06/30/2023   Lab Results  Component Value Date   TSH 2.490 06/24/2016   Lab Results  Component Value Date   HGBA1C 7.0 (A) 10/28/2023   Lab Results  Component Value Date   WBC 7.8 02/28/2023   HGB 16.7 02/28/2023   HCT 48.9 02/28/2023  MCV 91 02/28/2023   PLT 223 02/28/2023   Lab Results  Component Value Date   ALT 29 06/30/2023   AST 22 06/30/2023   ALKPHOS 73 06/30/2023   BILITOT 0.8 06/30/2023   No results found for: MARIEN BOLLS, VD25OH   Patient Active Problem List   Diagnosis Date Noted   Elevated PSA 06/10/2021   Tubular adenoma of colon    Type II diabetes mellitus with complication (HCC) 11/01/2018   Hyperlipidemia associated with type 2 diabetes mellitus (HCC) 08/06/2016   Plantar fasciitis, bilateral 06/24/2016   Family history of premature coronary heart  disease 05/28/2015   Essential hypertension 05/28/2015   Insomnia, persistent 05/28/2015   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    No Known Allergies  Past Surgical History:  Procedure Laterality Date   COLONOSCOPY WITH PROPOFOL  N/A 08/21/2019   Procedure: COLONOSCOPY WITH BIOPSY;  Surgeon: Unk Corinn Skiff, MD;  Location: [facility];  Service: Endoscopy;  Laterality: N/A;  Requests late as possible   NO PAST SURGERIES     none     POLYPECTOMY N/A 08/21/2019   Procedure: POLYPECTOMY;  Surgeon: Unk Corinn Skiff, MD;  Location: [facility];  Service: Endoscopy;  Laterality: N/A;    Social History   Tobacco Use   Smoking status: Never   Smokeless tobacco: Never  Vaping Use   Vaping status: Never Used  Substance Use Topics   Alcohol use: Yes    Alcohol/week: 1.0 standard drink of alcohol    Types: 1 Standard drinks or equivalent per week    Comment: couple times/year   Drug use: No     Medication list has been reviewed and updated.  Current Meds  Medication Sig   Accu-Chek Softclix Lancets lancets USE AS DIRECTED   amLODipine  (NORVASC ) 10 MG tablet Take 1 tablet (10 mg total) by mouth daily.   aspirin 81 MG EC tablet Take by mouth.   blood glucose meter kit and supplies KIT Dispense based on patient and insurance preference. Use up to four times daily as directed.   dapagliflozin  propanediol (FARXIGA ) 10 MG TABS tablet Take 1 tablet (10 mg total) by mouth daily before breakfast.   glimepiride  (AMARYL ) 2 MG tablet Take 1 tablet (2 mg total) by mouth daily before breakfast.   Glucose Blood (BLOOD GLUCOSE TEST STRIPS 333) STRP Use up to 4 times daily as directed   hydrochlorothiazide  (HYDRODIURIL ) 25 MG tablet Take 1 tablet (25 mg total) by mouth daily.   metFORMIN  (GLUCOPHAGE -XR) 500 MG 24 hr tablet TAKE 2 TABLETS DAILY WITH BREAKFAST   tadalafil  (CIALIS ) 5 MG tablet Take 1 tablet (5 mg total) by mouth daily.   [DISCONTINUED] atorvastatin  (LIPITOR) 20 MG tablet Take  1/2 (one-half) tablet by mouth once daily       10/28/2023    8:41 AM 06/30/2023    8:09 AM 02/28/2023    8:11 AM 10/11/2022    3:25 PM  GAD 7 : Generalized Anxiety Score  Nervous, Anxious, on Edge 0 0 0 0  Control/stop worrying 0 0 0 0  Worry too much - different things 0 0 0 0  Trouble relaxing 0 0 0 0  Restless 0 0 0 0  Easily annoyed or irritable 0 0 0 0  Afraid - awful might happen 0 0 0 0  Total GAD 7 Score 0 0 0 0  Anxiety Difficulty Not difficult at all Not difficult at all Not difficult at all Not difficult at all  03/01/2024    9:41 AM 10/28/2023    8:40 AM 06/30/2023    8:09 AM  Depression screen PHQ 2/9  Decreased Interest 0 0 0  Down, Depressed, Hopeless 0 0 0  PHQ - 2 Score 0 0 0  Altered sleeping  0 0  Tired, decreased energy  0 0  Change in appetite  0 0  Feeling bad or failure about yourself   0 0  Trouble concentrating  0 0  Moving slowly or fidgety/restless  0 0  Suicidal thoughts  0 0  PHQ-9 Score  0 0  Difficult doing work/chores  Not difficult at all Not difficult at all    BP Readings from Last 3 Encounters:  03/01/24 124/78  12/28/23 122/78  10/28/23 116/70    Physical Exam Vitals and nursing note reviewed.  Constitutional:      Appearance: Normal appearance. He is well-developed.  HENT:     Head: Normocephalic.     Right Ear: Tympanic membrane, ear canal and external ear normal.     Left Ear: Tympanic membrane, ear canal and external ear normal.     Nose: Nose normal.  Eyes:     Conjunctiva/sclera: Conjunctivae normal.     Pupils: Pupils are equal, round, and reactive to light.  Neck:     Thyroid: No thyromegaly.     Vascular: No carotid bruit.  Cardiovascular:     Rate and Rhythm: Normal rate and regular rhythm.     Heart sounds: Normal heart sounds.  Pulmonary:     Effort: Pulmonary effort is normal.     Breath sounds: Normal breath sounds. No wheezing.  Chest:  Breasts:    Right: No mass.     Left: No mass.  Abdominal:      General: Bowel sounds are normal.     Palpations: Abdomen is soft.     Tenderness: There is no abdominal tenderness.  Musculoskeletal:        General: Normal range of motion.     Cervical back: Normal range of motion and neck supple.  Lymphadenopathy:     Cervical: No cervical adenopathy.  Skin:    General: Skin is warm and dry.  Neurological:     Mental Status: He is alert and oriented to person, place, and time.     Deep Tendon Reflexes: Reflexes are normal and symmetric.  Psychiatric:        Attention and Perception: Attention normal.        Mood and Affect: Mood normal.        Thought Content: Thought content normal.    Diabetic Foot Exam - Simple   Simple Foot Form Diabetic Foot exam was performed with the following findings: Yes 03/01/2024  8:40 AM  Visual Inspection No deformities, no ulcerations, no other skin breakdown bilaterally: Yes Sensation Testing Intact to touch and monofilament testing bilaterally: Yes Pulse Check Posterior Tibialis and Dorsalis pulse intact bilaterally: Yes Comments      Wt Readings from Last 3 Encounters:  03/01/24 217 lb (98.4 kg)  12/28/23 224 lb (101.6 kg)  10/28/23 217 lb (98.4 kg)    BP 124/78 (BP Location: Right Arm, Patient Position: Sitting, Cuff Size: Normal)   Pulse 61   Resp 18   Ht 5' 7 (1.702 m)   Wt 217 lb (98.4 kg)   SpO2 98%   BMI 33.99 kg/m   Assessment and Plan:  Problem List Items Addressed This Visit       Unprioritized  Elevated PSA (Chronic)   Being followed by Urology PSA stable ~ 5 for the past three years      Essential hypertension (Chronic)   Blood pressure is well controlled on amlodipine  and hydrochlorothiazide . No medication side effects noted. Plan to continue current medications.       Relevant Medications   atorvastatin  (LIPITOR) 20 MG tablet   Other Relevant Orders   CBC with Differential/Platelet   Comprehensive metabolic panel with GFR   TSH   Hyperlipidemia associated  with type 2 diabetes mellitus (HCC) (Chronic)   LDL is  Lab Results  Component Value Date   LDLCALC 82 06/30/2023   Currently taking atorvastatin  20 mg.  No medication side effects or other concerns. Recommended LDL goal is < 70.       Relevant Medications   atorvastatin  (LIPITOR) 20 MG tablet   Other Relevant Orders   Lipid panel   Type II diabetes mellitus with complication (HCC) (Chronic)   Blood sugars have been stable.  No hypoglycemic events since last visit. Currently medications are MTF, Farxiga  and glipizide. Last visit medical regimen changes were none. Lab Results  Component Value Date   HGBA1C 7.0 (A) 10/28/2023          Relevant Medications   atorvastatin  (LIPITOR) 20 MG tablet   Other Relevant Orders   Comprehensive metabolic panel with GFR   Hemoglobin A1c   Microalbumin / creatinine urine ratio   Other Visit Diagnoses       Annual physical exam    -  Primary   continue regular exercise, work on healthy diet choices CRC screening up to date He declines Flu and Covid vaccines   Relevant Orders   CBC with Differential/Platelet   Comprehensive metabolic panel with GFR   Hemoglobin A1c   Lipid panel   TSH   Microalbumin / creatinine urine ratio     Long term current use of oral hypoglycemic drug           Return in about 4 months (around 07/01/2024) for TOC DM, HTN  Dr. Lemon.    Leita HILARIO Adie, MD [facility] Medicine [place]

## 2024-03-01 NOTE — Assessment & Plan Note (Signed)
 Being followed by Urology PSA stable ~ 5 for the past three years

## 2024-03-02 LAB — CBC WITH DIFFERENTIAL/PLATELET
Basophils Absolute: 0.1 x10E3/uL (ref 0.0–0.2)
Basos: 1 %
EOS (ABSOLUTE): 0.4 x10E3/uL (ref 0.0–0.4)
Eos: 4 %
Hematocrit: 53.8 % — ABNORMAL HIGH (ref 37.5–51.0)
Hemoglobin: 17.5 g/dL (ref 13.0–17.7)
Immature Grans (Abs): 0 x10E3/uL (ref 0.0–0.1)
Immature Granulocytes: 0 %
Lymphocytes Absolute: 1.8 x10E3/uL (ref 0.7–3.1)
Lymphs: 21 %
MCH: 29 pg (ref 26.6–33.0)
MCHC: 32.5 g/dL (ref 31.5–35.7)
MCV: 89 fL (ref 79–97)
Monocytes Absolute: 0.8 x10E3/uL (ref 0.1–0.9)
Monocytes: 9 %
Neutrophils Absolute: 5.8 x10E3/uL (ref 1.4–7.0)
Neutrophils: 65 %
Platelets: 238 x10E3/uL (ref 150–450)
RBC: 6.03 x10E6/uL — ABNORMAL HIGH (ref 4.14–5.80)
RDW: 12.6 % (ref 11.6–15.4)
WBC: 8.8 x10E3/uL (ref 3.4–10.8)

## 2024-03-02 LAB — MICROALBUMIN / CREATININE URINE RATIO
Creatinine, Urine: 56 mg/dL
Microalb/Creat Ratio: <5 mg/g{creat} (ref 0–29)
Microalbumin, Urine: <3 ug/mL

## 2024-03-02 LAB — LIPID PANEL
Chol/HDL Ratio: 4.1 ratio (ref 0.0–5.0)
Cholesterol, Total: 132 mg/dL (ref 100–199)
HDL: 32 mg/dL — ABNORMAL LOW (ref >39)
LDL Chol Calc (NIH): 69 mg/dL (ref 0–99)
Triglycerides: 184 mg/dL — ABNORMAL HIGH (ref 0–149)
VLDL Cholesterol Cal: 31 mg/dL (ref 5–40)

## 2024-03-02 LAB — COMPREHENSIVE METABOLIC PANEL WITH GFR
ALT: 31 IU/L (ref 0–44)
AST: 25 IU/L (ref 0–40)
Albumin: 4.3 g/dL (ref 3.8–4.9)
Alkaline Phosphatase: 67 IU/L (ref 47–123)
BUN/Creatinine Ratio: 16 (ref 9–20)
BUN: 16 mg/dL (ref 6–24)
Bilirubin Total: 0.9 mg/dL (ref 0.0–1.2)
CO2: 24 mmol/L (ref 20–29)
Calcium: 9.4 mg/dL (ref 8.7–10.2)
Chloride: 101 mmol/L (ref 96–106)
Creatinine, Ser: 0.98 mg/dL (ref 0.76–1.27)
Globulin, Total: 1.9 g/dL (ref 1.5–4.5)
Glucose: 115 mg/dL — ABNORMAL HIGH (ref 70–99)
Potassium: 4.3 mmol/L (ref 3.5–5.2)
Sodium: 139 mmol/L (ref 134–144)
Total Protein: 6.2 g/dL (ref 6.0–8.5)
eGFR: 91 mL/min/1.73 (ref >59)

## 2024-03-02 LAB — HEMOGLOBIN A1C
Est. average glucose Bld gHb Est-mCnc: 183 mg/dL
Hgb A1c MFr Bld: 8 % — ABNORMAL HIGH (ref 4.8–5.6)

## 2024-03-02 LAB — TSH: TSH: 2.12 u[IU]/mL (ref 0.450–4.500)

## 2024-03-05 ENCOUNTER — Ambulatory Visit: Payer: Self-pay | Admitting: Internal Medicine

## 2024-04-13 ENCOUNTER — Telehealth: Payer: Self-pay

## 2024-04-13 NOTE — Telephone Encounter (Signed)
 Waiting to receive fax and will complete and fax back. Will route to provider to see if she can look this up in her [facility] account and adjust the code.

## 2024-04-13 NOTE — Telephone Encounter (Signed)
 Copied from CRM [id number]. Topic: General - Other >> Apr 13, 2024 12:25 PM Willma R wrote: Reason for CRM: Alt[employer] from [employer] calling to verify a diagnosis code for the patient, received a denial for the patients labs dates 12/28/2023. Will also follow up with a fax  [employer] can be reached at [phone]

## 2024-04-17 ENCOUNTER — Other Ambulatory Visit: Payer: Self-pay | Admitting: Internal Medicine

## 2024-04-17 DIAGNOSIS — I10 Essential (primary) hypertension: Secondary | ICD-10-CM

## 2024-04-17 DIAGNOSIS — E118 Type 2 diabetes mellitus with unspecified complications: Secondary | ICD-10-CM

## 2024-04-18 NOTE — Telephone Encounter (Signed)
 Requested Prescriptions  Pending Prescriptions Disp Refills   hydrochlorothiazide  (HYDRODIURIL ) 25 MG tablet [Pharmacy Med Name: hydroCHLOROthiazide  25 MG Oral Tablet] 90 tablet 0    Sig: Take 1 tablet by mouth once daily     Cardiovascular: Diuretics - Thiazide Passed - 04/18/2024 12:29 PM      Passed - Cr in normal range and within 180 days    Creatinine  Date Value Ref Range Status  07/31/2012 1.03 0.60 - 1.30 mg/dL Final   Creatinine, Ser  Date Value Ref Range Status  03/01/2024 0.98 0.76 - 1.27 mg/dL Final         Passed - K in normal range and within 180 days    Potassium  Date Value Ref Range Status  03/01/2024 4.3 3.5 - 5.2 mmol/L Final  07/31/2012 3.8 3.5 - 5.1 mmol/L Final         Passed - Na in normal range and within 180 days    Sodium  Date Value Ref Range Status  03/01/2024 139 134 - 144 mmol/L Final  07/31/2012 138 136 - 145 mmol/L Final         Passed - Last BP in normal range    BP Readings from Last 1 Encounters:  03/01/24 124/78         Passed - Valid encounter within last 6 months    Recent Outpatient Visits           1 month ago Annual physical exam   [place] [facility] at [facility], Leita DEL, MD   3 months ago Positive RPR test   [facility] at [facility], Leita DEL, MD   5 months ago Type II diabetes mellitus with complication [facility])   [place] [facility] at [facility], Leita DEL, MD       Future Appointments             In 5 months Francisca, Redell BROCKS, MD [facility] [place]             FARXIGA  10 MG TABS tablet [Pharmacy Med Name: Farxiga  10 MG Oral Tablet] 90 tablet 0    Sig: TAKE 1 TABLET BY MOUTH ONCE DAILY BEFORE BREAKFAST     Endocrinology:  Diabetes - SGLT2 Inhibitors Failed - 04/18/2024 12:29 PM      Failed - HBA1C is between 0 and 7.9 and within 180 days    Hgb A1c MFr Bld  Date Value Ref Range Status   03/01/2024 8.0 (H) 4.8 - 5.6 % Final    Comment:             Prediabetes: 5.7 - 6.4          Diabetes: >6.4          Glycemic control for adults with diabetes: <7.0          Passed - Cr in normal range and within 360 days    Creatinine  Date Value Ref Range Status  07/31/2012 1.03 0.60 - 1.30 mg/dL Final   Creatinine, Ser  Date Value Ref Range Status  03/01/2024 0.98 0.76 - 1.27 mg/dL Final         Passed - eGFR in normal range and within 360 days    EGFR (African American)  Date Value Ref Range Status  07/31/2012 >60  Final   GFR calc Af Amer  Date Value Ref Range Status  02/20/2020 103 >  59 mL/min/1.73 Final    Comment:    **[facility] currently reports eGFR in compliance with the current**   recommendations of the [employer]. [facility] will   update reporting as new guidelines are published from the NKF-ASN   Task force.    EGFR (Non-African Amer.)  Date Value Ref Range Status  07/31/2012 >60  Final    Comment:    eGFR values <6mL/min/1.73 m2 may be an indication of chronic kidney disease (CKD). Calculated eGFR is useful in patients with stable renal function. The eGFR calculation will not be reliable in acutely ill patients when serum creatinine is changing rapidly. It is not useful in  patients on dialysis. The eGFR calculation may not be applicable to patients at the low and high extremes of body sizes, pregnant women, and vegetarians.    GFR, Estimated  Date Value Ref Range Status  02/08/2022 >60 >60 mL/min Final    Comment:    (NOTE) Calculated using the CKD-EPI Creatinine Equation (2021)    eGFR  Date Value Ref Range Status  03/01/2024 91 >59 mL/min/1.73 Final         Passed - Valid encounter within last 6 months    Recent Outpatient Visits           1 month ago Annual physical exam   [place] [facility] at [facility], Leita DEL, MD   3 months ago Positive RPR test   [facility] Primary Care &  [facility] Medicine at [facility], Leita DEL, MD   5 months ago Type II diabetes mellitus with complication [facility])   [place] [facility] Medicine at [facility], Leita DEL, MD       Future Appointments             In 5 months Francisca Redell BROCKS, MD [facility] [place]             amLODipine  (NORVASC ) 10 MG tablet [Pharmacy Med Name: amLODIPine  Besylate 10 MG Oral Tablet] 90 tablet 0    Sig: Take 1 tablet by mouth once daily     Cardiovascular: Calcium  Channel Blockers 2 Passed - 04/18/2024 12:29 PM      Passed - Last BP in normal range    BP Readings from Last 1 Encounters:  03/01/24 124/78         Passed - Last Heart Rate in normal range    Pulse Readings from Last 1 Encounters:  03/01/24 61         Passed - Valid encounter within last 6 months    Recent Outpatient Visits           1 month ago Annual physical exam   [place] [facility] at [facility], Leita DEL, MD   3 months ago Positive RPR test   [facility] Primary Care & [facility] Medicine at [facility], Leita DEL, MD   5 months ago Type II diabetes mellitus with complication [facility])   [place] [facility] Medicine at [facility], Leita DEL, MD       Future Appointments             In 5 months Francisca, Redell BROCKS, MD [facility] [place]

## 2024-04-27 ENCOUNTER — Other Ambulatory Visit: Payer: Self-pay | Admitting: Internal Medicine

## 2024-04-29 NOTE — Telephone Encounter (Signed)
 Requested Prescriptions  Pending Prescriptions Disp Refills   Accu-Chek Softclix Lancets lancets [Pharmacy Med Name: Accu-Chek Softclix Lancets Miscellaneous] 100 each 11    Sig: USE AS DIRECTED     Endocrinology: Diabetes - Testing Supplies Passed - 04/29/2024 12:35 PM      Passed - Valid encounter within last 12 months    Recent Outpatient Visits           1 month ago Annual physical exam   [place] [facility] at [facility], Leita DEL, MD   4 months ago Positive RPR test   [facility] Primary [facility] Medicine at [facility], Leita DEL, MD   6 months ago Type II diabetes mellitus with complication [facility])   [place] [facility] Medicine at [facility], Leita DEL, MD       Future Appointments             In 5 months Francisca, Redell BROCKS, MD [facility] [place]

## 2024-04-30 ENCOUNTER — Other Ambulatory Visit: Payer: Self-pay | Admitting: Internal Medicine

## 2024-04-30 DIAGNOSIS — E118 Type 2 diabetes mellitus with unspecified complications: Secondary | ICD-10-CM

## 2024-05-01 NOTE — Telephone Encounter (Signed)
 Requested Prescriptions  Pending Prescriptions Disp Refills   metFORMIN  (GLUCOPHAGE -XR) 500 MG 24 hr tablet [Pharmacy Med Name: METFORMIN  HCL ER TABS 500MG ] 180 tablet 1    Sig: TAKE 2 TABLETS DAILY WITH BREAKFAST     Endocrinology:  Diabetes - Biguanides Failed - 05/01/2024  4:20 PM      Failed - HBA1C is between 0 and 7.9 and within 180 days    Hgb A1c MFr Bld  Date Value Ref Range Status  03/01/2024 8.0 (H) 4.8 - 5.6 % Final    Comment:             Prediabetes: 5.7 - 6.4          Diabetes: >6.4          Glycemic control for adults with diabetes: <7.0          Failed - B12 Level in normal range and within 720 days    No results found for: VITAMINB12       Passed - Cr in normal range and within 360 days    Creatinine  Date Value Ref Range Status  07/31/2012 1.03 0.60 - 1.30 mg/dL Final   Creatinine, Ser  Date Value Ref Range Status  03/01/2024 0.98 0.76 - 1.27 mg/dL Final         Passed - eGFR in normal range and within 360 days    EGFR (African American)  Date Value Ref Range Status  07/31/2012 >60  Final   GFR calc Af Amer  Date Value Ref Range Status  02/20/2020 103 >59 mL/min/1.73 Final    Comment:    **[facility] currently reports eGFR in compliance with the current**   recommendations of the [employer]. [facility] will   update reporting as new guidelines are published from the NKF-ASN   Task force.    EGFR (Non-African Amer.)  Date Value Ref Range Status  07/31/2012 >60  Final    Comment:    eGFR values <4mL/min/1.73 m2 may be an indication of chronic kidney disease (CKD). Calculated eGFR is useful in patients with stable renal function. The eGFR calculation will not be reliable in acutely ill patients when serum creatinine is changing rapidly. It is not useful in  patients on dialysis. The eGFR calculation may not be applicable to patients at the low and high extremes of body sizes, pregnant women, and vegetarians.    GFR, Estimated   Date Value Ref Range Status  02/08/2022 >60 >60 mL/min Final    Comment:    (NOTE) Calculated using the CKD-EPI Creatinine Equation (2021)    eGFR  Date Value Ref Range Status  03/01/2024 91 >59 mL/min/1.73 Final         Passed - Valid encounter within last 6 months    Recent Outpatient Visits           2 months ago Annual physical exam   [place] [facility] at [facility], Leita DEL, MD   4 months ago Positive RPR test   [facility] Care & [facility] at [facility], Leita DEL, MD   6 months ago Type II diabetes mellitus with complication [facility])   [place] [facility] Medicine at [facility], Leita DEL, MD       Future Appointments             In 5 months Francisca, Redell BROCKS, MD [facility] [place]  Passed - CBC within normal limits and completed in the last 12 months    WBC  Date Value Ref Range Status  03/01/2024 8.8 3.4 - 10.8 x10E3/uL Final  02/08/2022 8.7 4.0 - 10.5 K/uL Final   RBC  Date Value Ref Range Status  03/01/2024 6.03 (H) 4.14 - 5.80 x10E6/uL Final  02/08/2022 5.25 4.22 - 5.81 MIL/uL Final   Hemoglobin  Date Value Ref Range Status  03/01/2024 17.5 13.0 - 17.7 g/dL Final   Hematocrit  Date Value Ref Range Status  03/01/2024 53.8 (H) 37.5 - 51.0 % Final   MCHC  Date Value Ref Range Status  03/01/2024 32.5 31.5 - 35.7 g/dL Final  91/71/7976 63.5 (H) 30.0 - 36.0 g/dL Final   [facility]  Date Value Ref Range Status  03/01/2024 29.0 26.6 - 33.0 pg Final  02/08/2022 31.2 26.0 - 34.0 pg Final   MCV  Date Value Ref Range Status  03/01/2024 89 79 - 97 fL Final  07/31/2012 88 80 - 100 fL Final   No results found for: PLTCOUNTKUC, LABPLAT, POCPLA RDW  Date Value Ref Range Status  03/01/2024 12.6 11.6 - 15.4 % Final  07/31/2012 12.3 11.5 - 14.5 % Final

## 2024-06-20 ENCOUNTER — Other Ambulatory Visit: Payer: Self-pay

## 2024-06-20 DIAGNOSIS — I10 Essential (primary) hypertension: Secondary | ICD-10-CM

## 2024-06-20 MED ORDER — HYDROCHLOROTHIAZIDE 25 MG PO TABS: 25.0000 mg | ORAL_TABLET | Freq: Every day | ORAL | 0 refills | Status: AC

## 2024-06-29 NOTE — Progress Notes (Signed)
 William [name]                                          MRN: [id number]   06/29/2024   The [facility]BCI Quality Team Specialist reviewed this patient medical record for the purposes of chart review for care gap closure. The following were reviewed: chart review for care gap closure-glycemic status assessment.    VBCI Quality Team

## 2024-07-02 ENCOUNTER — Ambulatory Visit: Admitting: Student

## 2024-07-02 ENCOUNTER — Encounter: Payer: Self-pay | Admitting: Student

## 2024-07-02 VITALS — BP 120/74 | HR 68 | Ht 67.0 in | Wt 224.0 lb

## 2024-07-02 DIAGNOSIS — E118 Type 2 diabetes mellitus with unspecified complications: Secondary | ICD-10-CM

## 2024-07-02 DIAGNOSIS — Z7984 Long term (current) use of oral hypoglycemic drugs: Secondary | ICD-10-CM

## 2024-07-02 DIAGNOSIS — I10 Essential (primary) hypertension: Secondary | ICD-10-CM

## 2024-07-02 DIAGNOSIS — E785 Hyperlipidemia, unspecified: Secondary | ICD-10-CM | POA: Diagnosis not present

## 2024-07-02 DIAGNOSIS — E1169 Type 2 diabetes mellitus with other specified complication: Secondary | ICD-10-CM

## 2024-07-02 LAB — POCT GLYCOSYLATED HEMOGLOBIN (HGB A1C): Hemoglobin A1C: 7.5 % — AB (ref 4.0–5.6)

## 2024-07-02 MED ORDER — AMLODIPINE BESYLATE 10 MG PO TABS: 10.0000 mg | ORAL_TABLET | Freq: Every day | ORAL | 0 refills | Status: AC

## 2024-07-02 NOTE — Assessment & Plan Note (Addendum)
 Currently on atorvastatin  20 mg daily. The 10-year ASCVD risk score (Arnett DK, et al., 2019) is: 10.1%. Continue current medications.

## 2024-07-02 NOTE — Assessment & Plan Note (Addendum)
 Medications are metformin  1000mg  daily, glimepiride  2 mg daily, farxiga  10 mg daily. Report home CBG between 120-170 between 3-9 pm, usually wakes at 2 am, has breakfast around 6 am, will have him check fasting glucoses. A1c improved to 7.5%. Taking metformin  500 mg daily does not have any AEs to this, does not remember why it was lowered. -increase metformin  to 1000 mg daily  -follow up in 4 months

## 2024-07-02 NOTE — Progress Notes (Signed)
 "  Established Patient Office Visit  Subjective   Patient ID: William [name], male    DOB: [birth year]  Age: 54 y.o. MRN: [id number]  Chief Complaint  Patient presents with   Diabetes Mellitus   Hypertension    William [name] is a 54 y.o. person with medical hx listed below who presents today for diabetes follow up.   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   atorvastatin  (LIPITOR) 20 MG tablet Take 1 tablet (20 mg total) by mouth daily.   blood glucose meter kit and supplies KIT Dispense based on patient and insurance preference. Use up to four times daily as directed.   Blood Glucose Monitoring Suppl (FREESTYLE LITE) DEVI 1 each by Other route 2 (two) times daily.   FARXIGA  10 MG TABS tablet TAKE 1 TABLET BY MOUTH ONCE DAILY BEFORE BREAKFAST   glimepiride  (AMARYL ) 2 MG tablet Take 1 tablet (2 mg total) by mouth daily before breakfast.   Glucose Blood (BLOOD GLUCOSE TEST STRIPS 333) STRP Use up to 4 times daily as directed   hydrochlorothiazide  (HYDRODIURIL ) 25 MG tablet Take 1 tablet (25 mg total) by mouth daily.   metFORMIN  (GLUCOPHAGE -XR) 500 MG 24 hr tablet TAKE 2 TABLETS DAILY WITH BREAKFAST   tadalafil  (CIALIS ) 5 MG tablet Take 1 tablet (5 mg total) by mouth daily.   [DISCONTINUED] amLODipine  (NORVASC ) 10 MG tablet Take 1 tablet by mouth once daily   amLODipine   (NORVASC ) 10 MG tablet Take 1 tablet (10 mg total) by mouth daily.   No facility-administered encounter medications on file as of 07/02/2024.    BP 120/74   Pulse 68   Ht 5' 7 (1.702 m)   Wt 224 lb (101.6 kg)   SpO2 97%   BMI 35.08 kg/m  BP Readings from Last 3 Encounters:  07/02/24 120/74  03/01/24 124/78  12/28/23 122/78    Physical Exam Constitutional:      Appearance: Normal appearance.  HENT:     Head: Normocephalic and atraumatic.  Cardiovascular:     Rate and Rhythm: Normal rate and regular rhythm.     Heart sounds: No murmur heard. Pulmonary:     Effort: Pulmonary effort is normal.     Breath sounds: No rhonchi or rales.  Abdominal:     General: Abdomen is flat. Bowel sounds are normal. There is no distension.     Palpations: Abdomen is soft.     Tenderness: There is no abdominal tenderness.  Musculoskeletal:        General: Normal range of motion.     Right lower leg: No edema.     Left lower leg: No edema.  Skin:    General: Skin is warm and dry.     Capillary Refill: Capillary refill takes less than 2 seconds.  Neurological:     General: No focal deficit  present.     Mental Status: He is alert and oriented to person, place, and time.  Psychiatric:        Mood and Affect: Mood normal.        Behavior: Behavior normal.        07/02/2024    8:20 AM 03/01/2024    9:41 AM 10/28/2023    8:40 AM  Depression screen PHQ 2/9  Decreased Interest 0 0 0  Down, Depressed, Hopeless 0 0 0  PHQ - 2 Score 0 0 0  Altered sleeping   0  Tired, decreased energy   0  Change in appetite   0  Feeling bad or failure about yourself    0  Trouble concentrating   0  Moving slowly or fidgety/restless   0  Suicidal thoughts   0  PHQ-9 Score   0   Difficult doing work/chores   Not difficult at all     Data saved with a previous flowsheet row definition       07/02/2024    8:20 AM 10/28/2023    8:41 AM 06/30/2023    8:09 AM 02/28/2023    8:11 AM  GAD 7 : Generalized  Anxiety Score  Nervous, Anxious, on Edge 0 0 0 0  Control/stop worrying 0 0 0 0  Worry too much - different things  0 0 0  Trouble relaxing  0 0 0  Restless  0 0 0  Easily annoyed or irritable  0 0 0  Afraid - awful might happen  0 0 0  Total GAD 7 Score  0 0 0  Anxiety Difficulty  Not difficult at all Not difficult at all Not difficult at all    Results for orders placed or performed in visit on 07/02/24  POCT HgB A1C  Result Value Ref Range   Hemoglobin A1C 7.5 (A) 4.0 - 5.6 %    Last CBC Lab Results  Component Value Date   WBC 8.8 03/01/2024   HGB 17.5 03/01/2024   HCT 53.8 (H) 03/01/2024   MCV 89 03/01/2024   MCH 29.0 03/01/2024   RDW 12.6 03/01/2024   PLT 238 03/01/2024   Last metabolic panel Lab Results  Component Value Date   GLUCOSE 115 (H) 03/01/2024   NA 139 03/01/2024   K 4.3 03/01/2024   CL 101 03/01/2024   CO2 24 03/01/2024   BUN 16 03/01/2024   CREATININE 0.98 03/01/2024   EGFR 91 03/01/2024   CALCIUM  9.4 03/01/2024   PROT 6.2 03/01/2024   ALBUMIN 4.3 03/01/2024   LABGLOB 1.9 03/01/2024   AGRATIO 2.5 (H) 02/04/2021   BILITOT 0.9 03/01/2024   ALKPHOS 67 03/01/2024   AST 25 03/01/2024   ALT 31 03/01/2024   ANIONGAP 9 02/08/2022   Last lipids Lab Results  Component Value Date   CHOL 132 03/01/2024   HDL 32 (L) 03/01/2024   LDLCALC 69 03/01/2024   TRIG 184 (H) 03/01/2024   CHOLHDL 4.1 03/01/2024   Last hemoglobin A1c Lab Results  Component Value Date   HGBA1C 7.5 (A) 07/02/2024      The 10-year ASCVD risk score (Arnett DK, et al., 2019) is: 10.1%    Assessment & Plan:  Type II diabetes mellitus with complication (HCC) Assessment & Plan: Medications are metformin  1000mg  daily, glimepiride  2 mg daily, farxiga  10 mg daily. Report home CBG between 120-170 between 3-9 pm, usually wakes at 2 am, has breakfast around 6 am, will have him check fasting  glucoses. A1c improved to 7.5%. Taking metformin  500 mg daily does not have any AEs to this,  does not remember why it was lowered. -increase metformin  to 1000 mg daily  -follow up in 4 months   Orders: -     POCT glycosylated hemoglobin (Hb A1C)  Essential hypertension Assessment & Plan: Current medication is HCTZ 25 mg daily and amlodipine  10 mg daily. Well controlled today. Stable CMP on 03/01/2024.  Orders: -     amLODIPine  Besylate; Take 1 tablet (10 mg total) by mouth daily.  Dispense: 90 tablet; Refill: 0  Hyperlipidemia associated with type 2 diabetes mellitus (HCC) Assessment & Plan: Currently on atorvastatin  20 mg daily. The 10-year ASCVD risk score (Arnett DK, et al., 2019) is: 10.1%. Continue current medications.       Return in about 4 months (around 10/30/2024) for DM.    Harlene Saddler, MD "

## 2024-07-02 NOTE — Assessment & Plan Note (Addendum)
 Current medication is HCTZ 25 mg daily and amlodipine  10 mg daily. Well controlled today. Stable CMP on 03/01/2024.

## 2024-07-18 ENCOUNTER — Other Ambulatory Visit: Payer: Self-pay

## 2024-07-18 DIAGNOSIS — E118 Type 2 diabetes mellitus with unspecified complications: Secondary | ICD-10-CM

## 2024-07-18 MED ORDER — DAPAGLIFLOZIN PROPANEDIOL 10 MG PO TABS: 10.0000 mg | ORAL_TABLET | Freq: Every day | ORAL | 1 refills | Status: AC

## 2024-10-05 ENCOUNTER — Other Ambulatory Visit

## 2024-10-09 ENCOUNTER — Ambulatory Visit: Admitting: Urology

## 2024-11-09 ENCOUNTER — Ambulatory Visit: Admitting: Student
